# Patient Record
Sex: Male | Born: 1975
Health system: Southern US, Community
[De-identification: ages and names within clinical notes are randomized; demographics above are authoritative.]

## PROBLEM LIST (undated history)

## (undated) DIAGNOSIS — I10 Essential (primary) hypertension: Secondary | ICD-10-CM

## (undated) DIAGNOSIS — K219 Gastro-esophageal reflux disease without esophagitis: Secondary | ICD-10-CM

---

## 1997-07-10 ENCOUNTER — Ambulatory Visit (HOSPITAL_COMMUNITY): Admission: RE | Admit: 1997-07-10 | Discharge: 1997-07-10 | Payer: Self-pay | Admitting: Gastroenterology

## 1998-01-13 ENCOUNTER — Encounter: Payer: Self-pay | Admitting: Emergency Medicine

## 1998-01-14 ENCOUNTER — Inpatient Hospital Stay: Admission: EM | Admit: 1998-01-14 | Discharge: 1998-01-14 | Payer: Self-pay | Admitting: Emergency Medicine

## 1998-01-14 ENCOUNTER — Encounter: Payer: Self-pay | Admitting: *Deleted

## 1998-01-15 ENCOUNTER — Encounter: Payer: Self-pay | Admitting: *Deleted

## 1998-01-15 ENCOUNTER — Ambulatory Visit (HOSPITAL_COMMUNITY): Admission: RE | Admit: 1998-01-15 | Discharge: 1998-01-15 | Payer: Self-pay | Admitting: *Deleted

## 1998-10-30 ENCOUNTER — Emergency Department (HOSPITAL_COMMUNITY): Admission: EM | Admit: 1998-10-30 | Discharge: 1998-10-30 | Payer: Self-pay | Admitting: Emergency Medicine

## 2001-10-01 ENCOUNTER — Ambulatory Visit (HOSPITAL_BASED_OUTPATIENT_CLINIC_OR_DEPARTMENT_OTHER): Admission: RE | Admit: 2001-10-01 | Discharge: 2001-10-01 | Payer: Self-pay | Admitting: Internal Medicine

## 2003-04-12 HISTORY — PX: WISDOM TOOTH EXTRACTION: SHX21

## 2004-11-04 ENCOUNTER — Other Ambulatory Visit: Payer: Self-pay

## 2004-11-04 ENCOUNTER — Emergency Department: Payer: Self-pay | Admitting: Emergency Medicine

## 2005-12-21 ENCOUNTER — Emergency Department (HOSPITAL_COMMUNITY): Admission: EM | Admit: 2005-12-21 | Discharge: 2005-12-21 | Payer: Self-pay | Admitting: *Deleted

## 2005-12-22 ENCOUNTER — Ambulatory Visit: Payer: Self-pay | Admitting: Psychiatry

## 2005-12-22 ENCOUNTER — Inpatient Hospital Stay (HOSPITAL_COMMUNITY): Admission: AD | Admit: 2005-12-22 | Discharge: 2005-12-23 | Payer: Self-pay | Admitting: Psychiatry

## 2008-01-09 ENCOUNTER — Emergency Department (HOSPITAL_COMMUNITY): Admission: EM | Admit: 2008-01-09 | Discharge: 2008-01-09 | Payer: Self-pay | Admitting: Emergency Medicine

## 2010-01-06 ENCOUNTER — Emergency Department (HOSPITAL_COMMUNITY): Admission: EM | Admit: 2010-01-06 | Discharge: 2010-01-07 | Payer: Self-pay | Admitting: Emergency Medicine

## 2010-06-24 LAB — BASIC METABOLIC PANEL
BUN: 14 mg/dL (ref 6–23)
Calcium: 9.2 mg/dL (ref 8.4–10.5)
GFR calc non Af Amer: >60 mL/min (ref >60)
Glucose, Bld: 92 mg/dL (ref 70–99)

## 2010-06-24 LAB — DIFFERENTIAL
Basophils Absolute: 0 10*3/uL (ref 0.0–0.1)
Basophils Relative: 0 % (ref 0–1)
Eosinophils Absolute: 0.3 10*3/uL (ref 0.0–0.7)
Monocytes Absolute: 0.7 10*3/uL (ref 0.1–1.0)
Neutro Abs: 6.9 10*3/uL (ref 1.7–7.7)
Neutrophils Relative %: 63 % (ref 43–77)

## 2010-06-24 LAB — CBC
MCH: 32.2 pg (ref 26.0–34.0)
MCHC: 35.6 g/dL (ref 30.0–36.0)
RDW: 12.8 % (ref 11.5–15.5)

## 2010-08-27 NOTE — Discharge Summary (Signed)
NAMECHASTON, Hunter Price                 ACCOUNT NO.:  [id number]   MEDICAL RECORD NO.:  [id number]          PATIENT TYPE:  IPS   LOCATION:  0307                          FACILITY:  [facility]   PHYSICIAN:  Geoffery Lyons, M.D.      DATE OF BIRTH:  10-30-1975   DATE OF ADMISSION:  12/22/2005  DATE OF DISCHARGE:  12/23/2005                                 DISCHARGE SUMMARY   IDENTIFYING INFORMATION:  This is a 35 year old white male who is married.  This is a voluntary admission.   HISTORY OF PRESENT ILLNESS:  This patient presented as a walk-in to our  hospital yesterday requesting assistance with staying off alcohol.  He has  been binge-drinking and popping pills, both Xanax and hydrocodone in binge  pattern a couple of times a week in order to take the edge off my  irritability.  He endorses increased stress, depressed mood and  irritability since marital conflict began about two months ago.  He is now  separated from his wife but they are making arrangements to get back  together.  He has a history of similar binge-drinking and substance use in  the past but was completely abstinent and functioning without substances for  more than a year and wants to get back to being sober again.  He reports  that he starts popping the pills after he starts drinking and things lately  have been getting out of hand.  He has been drinking in binge pattern more  than a fifth a day and last Friday binged on two fifths in one day.  Last  Wednesday, he says he found himself popping more than 12 pills and presented  himself to the emergency room where he was medically cleared and discharged  for outpatient follow-up.  At this time, he denies any suicidal thoughts as  he never did intend to commit suicide but was becoming very careless with  his drinking.  He is requesting to be discharged today for outpatient follow-  up so he can maintain employment.   PAST PSYCHIATRIC HISTORY:  No history of suicide attempts.   Denies history  of suicidal ideation.  No prior history of detoxes.   SOCIAL HISTORY:  Single white male, currently married, 35 years old.  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  No current legal  charges.   FAMILY HISTORY:  Remarkable for a mother with a history of depression with  hospitalizations.   MEDICAL HISTORY:  The patient has no regular primary care Krissy Orebaugh.  The  patient is healthy.   MEDICATIONS:  None.  The patient has been using Xanax, hydrocodone off the  streets.  Last use on the 12th.  Last drink was on the 12th.   ALLERGIES:  No known drug allergies.   POSITIVE PHYSICAL FINDINGS:  The patient's full physical examination is  noted in the record.  Healthy white male.  CIWA gauged at less than 0.  Motor function intact.  No tremor.  It is nonfocal.  Gait is normal.  Cranial nerves 2-12 are intact.  No  remarkable findings.   LABORATORY DATA:  CBC with WBC 8.4, hemoglobin 15.6, hematocrit 44.6,  platelets 249,000.  Electrolytes with sodium 142, potassium 4.1, chloride  107, carbon dioxide 30, BUN 13, creatinine 1.5.  Random glucose 79.  Liver  enzymes with SGOT 24, SGPT 33, alkaline phosphatase 82 and total bilirubin  0.8.  Calcium normal at 9.9 and magnesium 2.2.  TSH within normal limits at  1.610.  Acetaminophen level was less than 10 when he was in the emergency  room last week.  Urine drug screen was positive at that time for opiates.  Negative for cocaine, benzodiazepines and all other substances and this was  on the 12th.   MENTAL STATUS EXAM:  Today, this is a fully alert male.  Pleasant,  cooperative, polite, appropriate.  Motor is normal.  CIWA 0.  Speech normal  in pace, tone, amount and production.  It is fluent, articulate.  Mood  euthymic.  He is pleasant and cooperative, joking, able to appreciate humor.  Thought process logical, coherent, goal directed.  He thought through his  triggers and what would help him stay sober.   Knows exactly what kind of an  experience in a group that he is looking for.  Able to describe his triggers  and what he plans to do about them.  Categorically contracts for safety.  Cognition is well-preserved.  No paranoia or flight of ideas.  No psychosis.  No dangerous ideas.   DIAGNOSES:  AXIS I:  Alcohol abuse.  Polysubstance abuse.  AXIS II:  No diagnosis.  AXIS III:  No diagnosis.  AXIS IV:  Moderate (problems with social functioning).  AXIS V:  Current 60; past year 66.   PLAN:  Discharge this patient to home.  He has been referred to the [facility] to follow up.  Understands the plan.  No medications were prescribed  today.  He is in agreement with the plan and will leave today.      Margaret A. Scott, N.P.      Geoffery Lyons, M.D.  Electronically Signed    MAS/MEDQ  D:  12/23/2005  T:  12/24/2005  Job:  [id number]

## 2011-05-21 ENCOUNTER — Encounter (HOSPITAL_COMMUNITY): Payer: Self-pay | Admitting: Emergency Medicine

## 2011-05-21 ENCOUNTER — Emergency Department (HOSPITAL_COMMUNITY)
Admission: EM | Admit: 2011-05-21 | Discharge: 2011-05-21 | Disposition: A | Discharge status: Home / Self Care | Payer: Self-pay | Attending: Emergency Medicine | Admitting: Emergency Medicine

## 2011-05-21 DIAGNOSIS — T7840XA Allergy, unspecified, initial encounter: Secondary | ICD-10-CM

## 2011-05-21 DIAGNOSIS — L509 Urticaria, unspecified: Secondary | ICD-10-CM | POA: Insufficient documentation

## 2011-05-21 DIAGNOSIS — I10 Essential (primary) hypertension: Secondary | ICD-10-CM | POA: Insufficient documentation

## 2011-05-21 DIAGNOSIS — R0602 Shortness of breath: Secondary | ICD-10-CM | POA: Insufficient documentation

## 2011-05-21 DIAGNOSIS — R21 Rash and other nonspecific skin eruption: Secondary | ICD-10-CM | POA: Insufficient documentation

## 2011-05-21 HISTORY — DX: Essential (primary) hypertension: I10

## 2011-05-21 MED ORDER — DIPHENHYDRAMINE HCL 50 MG/ML IJ SOLN
50.0000 mg | Freq: Once | INTRAMUSCULAR | Status: AC
  Administered 2011-05-21: 50 mg via INTRAVENOUS

## 2011-05-21 MED ORDER — FAMOTIDINE IN NACL 20-0.9 MG/50ML-% IV SOLN
20.0000 mg | Freq: Once | INTRAVENOUS | Status: AC
  Administered 2011-05-21: 20 mg via INTRAVENOUS

## 2011-05-21 MED ORDER — EPINEPHRINE 0.3 MG/0.3ML IJ DEVI
INTRAMUSCULAR | Status: AC
  Filled 2011-05-21: qty 0.3

## 2011-05-21 MED ORDER — EPINEPHRINE 0.3 MG/0.3ML IJ DEVI
0.3000 mg | Freq: Once | INTRAMUSCULAR | Status: AC
  Administered 2011-05-21: 0.3 mg via INTRAMUSCULAR

## 2011-05-21 MED ORDER — DIPHENHYDRAMINE HCL 50 MG/ML IJ SOLN
INTRAMUSCULAR | Status: AC
  Administered 2011-05-21: 50 mg via INTRAVENOUS
  Filled 2011-05-21: qty 1

## 2011-05-21 MED ORDER — HYDROCORTISONE SOD SUCCINATE 100 MG IJ SOLR
INTRAMUSCULAR | Status: AC
  Administered 2011-05-21: 125 mg
  Filled 2011-05-21: qty 4

## 2011-05-21 MED ORDER — FAMOTIDINE IN NACL 20-0.9 MG/50ML-% IV SOLN
INTRAVENOUS | Status: AC
  Administered 2011-05-21: 20 mg via INTRAVENOUS
  Filled 2011-05-21: qty 50

## 2011-05-21 MED ORDER — METHYLPREDNISOLONE SODIUM SUCC 125 MG IJ SOLR: 125.0000 mg | Freq: Once | INTRAMUSCULAR | Status: DC

## 2011-05-21 MED ORDER — EPINEPHRINE 0.3 MG/0.3ML IJ DEVI: 0.3000 mg | INTRAMUSCULAR | Status: AC | PRN

## 2011-05-21 MED ORDER — PREDNISONE 10 MG PO TABS: 20.0000 mg | ORAL_TABLET | Freq: Every day | ORAL | Status: DC

## 2011-05-21 NOTE — ED Notes (Signed)
MD at bedside. 

## 2011-05-21 NOTE — ED Notes (Signed)
Pt c/o allergic reaction and swelling in the throat.  Pt states he is breaking out hives on legs, torso, arms, and back. Pt states symptoms are gradually getting worse.

## 2011-05-21 NOTE — ED Provider Notes (Signed)
History     CSN: [id number]  Arrival date & time 05/21/11  1918   First MD Initiated Contact with Patient 05/21/11 1933      Chief Complaint  Patient presents with  . Allergic Reaction    throat closing    (Consider location/radiation/quality/duration/timing/severity/associated sxs/prior treatment) Patient is a 36 y.o. male presenting with allergic reaction. The history is provided by the patient.  Allergic Reaction The primary symptoms are  shortness of breath, rash and urticaria. The primary symptoms do not include wheezing, abdominal pain, nausea, vomiting, dizziness or angioedema. The current episode started 6 to 12 hours ago. The problem has been gradually worsening. This is a new problem.  The rash is associated with itching.   Significant symptoms also include itching.  Pt noticed hives on his ext and abd this afternoon. He then began to feel swelling in his throat with some mild voice changes. He does not recall any new foods, medicines, or exposures. No cough or wheezing. No prev hx of angioedema.   Past Medical History  Diagnosis Date  . Hypertension     No past surgical history on file.  No family history on file.  History  Substance Use Topics  . Smoking status: Not on file  . Smokeless tobacco: Not on file  . Alcohol Use:       Review of Systems  Constitutional: Negative for fever and chills.  HENT: Positive for trouble swallowing and voice change.   Respiratory: Positive for shortness of breath. Negative for wheezing.   Cardiovascular: Negative for chest pain.  Gastrointestinal: Negative for nausea, vomiting and abdominal pain.  Musculoskeletal: Negative for joint swelling.  Skin: Positive for itching and rash.  Neurological: Negative for dizziness, weakness, numbness and headaches.    Allergies  Amoxicillin and Penicillins  Home Medications   Current Outpatient Rx  Name Route Sig Dispense Refill  . EPINEPHRINE 0.3 MG/0.3ML IJ DEVI  Intramuscular Inject 0.3 mLs (0.3 mg total) into the muscle as needed. 1 Device 0  . PREDNISONE 10 MG PO TABS Oral Take 2 tablets (20 mg total) by mouth daily. 10 tablet 0    BP 118/84  Pulse 75  Resp 16  SpO2 98%  Physical Exam  Nursing note and vitals reviewed. Constitutional: He is oriented to person, place, and time. He appears well-developed and well-nourished. No distress.  HENT:  Head: Normocephalic and atraumatic.  Mouth/Throat: Oropharynx is clear and moist.       No tongue or lip swelling  Eyes: EOM are normal. Pupils are equal, round, and reactive to light.  Neck: Normal range of motion. Neck supple.  Cardiovascular: Normal rate and regular rhythm.   Pulmonary/Chest: Effort normal and breath sounds normal. No stridor. No respiratory distress. He has no wheezes. He has no rales.  Abdominal: Soft. Bowel sounds are normal. He exhibits no mass. There is no tenderness. There is no rebound and no guarding.  Musculoskeletal: Normal range of motion. He exhibits no edema and no tenderness.  Neurological: He is alert and oriented to person, place, and time.       5/5 motor, sensation intact  Skin: Skin is warm and dry. Rash noted. There is erythema.       Erythematous macules. Blanching. Pruritic. Located on bl lower ext and lower abdomen  Psychiatric: He has a normal mood and affect. His behavior is normal.    ED Course  Procedures (including critical care time)  Labs Reviewed - No data to display No results  found.   1. Allergic reaction       MDM    Pt states he is feeling much better. No airway swelling or compromise. Rash has improved significantly. I have observed pt in ED for several hours with no return of respiratory symptoms. I have advised pt to return immediately for any changes or concerns        Loren Racer, MD 05/21/11 [phone]

## 2011-07-10 ENCOUNTER — Ambulatory Visit (INDEPENDENT_AMBULATORY_CARE_PROVIDER_SITE_OTHER): Payer: Self-pay | Admitting: Family Medicine

## 2011-07-10 VITALS — BP 142/89 | HR 71 | Temp 98.3°F | Resp 16 | Ht 69.0 in | Wt 233.0 lb

## 2011-07-10 DIAGNOSIS — G4700 Insomnia, unspecified: Secondary | ICD-10-CM

## 2011-07-10 DIAGNOSIS — B353 Tinea pedis: Secondary | ICD-10-CM

## 2011-07-10 DIAGNOSIS — F419 Anxiety disorder, unspecified: Secondary | ICD-10-CM

## 2011-07-10 MED ORDER — CLONAZEPAM 0.5 MG PO TABS: 0.5000 mg | ORAL_TABLET | Freq: Two times a day (BID) | ORAL | Status: DC | PRN

## 2011-07-10 MED ORDER — TERBINAFINE HCL 250 MG PO TABS: 250.0000 mg | ORAL_TABLET | Freq: Every day | ORAL | Status: DC

## 2011-07-10 NOTE — Progress Notes (Signed)
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  This makes life horrible for Hunter Price.  O:  Appropriate Also, feet have water blisters on arches  A:  Tinea pedis Stress  P:  Clonazepam 0.5 bid prn lamisel 250 qd x 2 weeks.

## 2011-08-09 ENCOUNTER — Ambulatory Visit (INDEPENDENT_AMBULATORY_CARE_PROVIDER_SITE_OTHER): Payer: Self-pay | Admitting: Internal Medicine

## 2011-08-09 VITALS — BP 142/80 | HR 85 | Temp 98.3°F | Resp 16 | Ht 69.0 in | Wt 232.0 lb

## 2011-08-09 DIAGNOSIS — T63001A Toxic effect of unspecified snake venom, accidental (unintentional), initial encounter: Secondary | ICD-10-CM

## 2011-08-09 DIAGNOSIS — B353 Tinea pedis: Secondary | ICD-10-CM

## 2011-08-09 DIAGNOSIS — W5911XA Bitten by nonvenomous snake, initial encounter: Secondary | ICD-10-CM

## 2011-08-09 DIAGNOSIS — T6391XA Toxic effect of contact with unspecified venomous animal, accidental (unintentional), initial encounter: Secondary | ICD-10-CM

## 2011-08-09 MED ORDER — TERBINAFINE HCL 250 MG PO TABS: 250.0000 mg | ORAL_TABLET | Freq: Every day | ORAL | Status: AC

## 2011-08-09 MED ORDER — CEPHALEXIN 500 MG PO CAPS: 500.0000 mg | ORAL_CAPSULE | Freq: Four times a day (QID) | ORAL | Status: AC

## 2011-08-09 NOTE — Patient Instructions (Signed)
Wash daily. Watch for signs of infection. Keflex as directed. advil for pain.

## 2011-08-09 NOTE — Progress Notes (Signed)
  Subjective:    Patient ID: Hunter Price, male    DOB: 1975/07/17, 36 y.o.   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 has the snake.  has tingling to the area.  no redness no pain.  moderate in severity  no radiation  has a headache Moderated in severity 6/10 no feverReview of Systems  Constitutional: Negative.   HENT: Negative.   Eyes: Negative.   Respiratory: Negative.   Cardiovascular: Negative.   Gastrointestinal: Negative.   Genitourinary: Negative.   Musculoskeletal: Negative.   Skin: Negative.   Neurological: Negative.   Hematological: Negative.   Psychiatric/Behavioral: Negative.   All other systems reviewed and are negative.       Objective:   Physical Exam  Nursing note and vitals reviewed. Constitutional: He is oriented to person, place, and time. He appears well-developed and well-nourished.  HENT:  Head: Normocephalic and atraumatic.  Right Ear: External ear normal.  Left Ear: External ear normal.  Eyes: Conjunctivae and EOM are normal. Pupils are equal, round, and reactive to light.  Neck: Normal range of motion. Neck supple.  Cardiovascular: Normal rate and regular rhythm.   Pulmonary/Chest: Effort normal and breath sounds normal.  Abdominal: Soft. Bowel sounds are normal.  Musculoskeletal: He exhibits tenderness.       Right hand tender no redness no bite marks  Neurological: He is alert and oriented to person, place, and time. He has normal reflexes.  Skin: Skin is warm and dry.  Psychiatric: He has a normal mood and affect. His behavior is normal. Judgment and thought content normal.   Snake is a small black snake not a copperhead pts tetanus was within the past 6 years.        Assessment & Plan:  Snake bite Wash with soap and water rx keflex Attain tetanus immunization status ok

## 2011-08-22 ENCOUNTER — Ambulatory Visit (INDEPENDENT_AMBULATORY_CARE_PROVIDER_SITE_OTHER): Payer: Self-pay | Admitting: Family Medicine

## 2011-08-22 ENCOUNTER — Encounter: Payer: Self-pay | Admitting: Family Medicine

## 2011-08-22 VITALS — BP 149/90 | HR 73 | Temp 97.8°F | Resp 18

## 2011-08-22 DIAGNOSIS — R21 Rash and other nonspecific skin eruption: Secondary | ICD-10-CM

## 2011-08-22 DIAGNOSIS — F419 Anxiety disorder, unspecified: Secondary | ICD-10-CM

## 2011-08-22 LAB — POCT GLYCOSYLATED HEMOGLOBIN (HGB A1C): Hemoglobin A1C: 5.1

## 2011-08-22 LAB — GLUCOSE, POCT (MANUAL RESULT ENTRY): POC Glucose: 77

## 2011-08-22 MED ORDER — CLONAZEPAM 1 MG PO TABS: 1.0000 mg | ORAL_TABLET | Freq: Two times a day (BID) | ORAL | Status: DC | PRN

## 2011-08-22 NOTE — Progress Notes (Signed)
36 year old gentleman who comes in with his father to be seen. He complains of nonhealing areas on her skin: Abdomen, back, left forearm. He was rechecked for diabetes. His father is had elevated blood sugars in the past.  Objective multiple small 3-4 mm papules, one large 1 cm deep and hard subcutaneous nodule right buttock, 4 cm streaky rash on right abdomen. Results for orders placed during the hospital encounter of 01/06/10  BASIC METABOLIC PANEL      Component Value Range   Sodium 137  135 - 145 (mEq/L)   Potassium 3.4 (*) 3.5 - 5.1 (mEq/L)   Chloride 104  96 - 112 (mEq/L)   CO2 26  19 - 32 (mEq/L)   Glucose, Bld 92  70 - 99 (mg/dL)   BUN 14  6 - 23 (mg/dL)   Creatinine, Ser 1.47  0.4 - 1.5 (mg/dL)   Calcium 9.2  8.4 - 82.9 (mg/dL)   GFR calc non Af Amer >60  >60 (mL/min)   GFR calc Af Amer    >60 (mL/min)   Value: >60            The eGFR has been calculated     using the MDRD equation.     This calculation has not been     validated in all clinical     situations.     eGFR's persistently     <60 mL/min signify     possible Chronic Kidney Disease.  CBC      Component Value Range   WBC 11.0 (*) 4.0 - 10.5 (K/uL)   RBC 5.13  4.22 - 5.81 (MIL/uL)   Hemoglobin 16.5  13.0 - 17.0 (g/dL)   HCT 56.2  13.0 - 86.5 (%)   MCV 90.3  78.0 - 100.0 (fL)   MCH 32.2  26.0 - 34.0 (pg)   MCHC 35.6  30.0 - 36.0 (g/dL)   RDW 78.4  69.6 - 29.5 (%)   Platelets 227  150 - 400 (K/uL)  DIFFERENTIAL      Component Value Range   Neutrophils Relative 63  43 - 77 (%)   Neutro Abs 6.9  1.7 - 7.7 (K/uL)   Lymphocytes Relative 28  12 - 46 (%)   Lymphs Abs 3.1  0.7 - 4.0 (K/uL)   Monocytes Relative 6  3 - 12 (%)   Monocytes Absolute 0.7  0.1 - 1.0 (K/uL)   Eosinophils Relative 3  0 - 5 (%)   Eosinophils Absolute 0.3  0.0 - 0.7 (K/uL)   Basophils Relative 0  0 - 1 (%)   Basophils Absolute 0.0  0.0 - 0.1 (K/uL)     Assessment: Probable staph  Plan: Doxycycline 100 twice a day x7  Results for  orders placed in visit on 08/22/11  GLUCOSE, POCT (MANUAL RESULT ENTRY)      Component Value Range   POC Glucose 77    POCT GLYCOSYLATED HEMOGLOBIN (HGB A1C)      Component Value Range   Hemoglobin A1C 5.1

## 2012-11-11 ENCOUNTER — Emergency Department (HOSPITAL_COMMUNITY)
Admission: EM | Admit: 2012-11-11 | Discharge: 2012-11-11 | Disposition: A | Discharge status: Home / Self Care | Payer: Self-pay | Attending: Emergency Medicine | Admitting: Emergency Medicine

## 2012-11-11 ENCOUNTER — Encounter (HOSPITAL_COMMUNITY): Payer: Self-pay | Admitting: *Deleted

## 2012-11-11 DIAGNOSIS — Y939 Activity, unspecified: Secondary | ICD-10-CM | POA: Insufficient documentation

## 2012-11-11 DIAGNOSIS — L255 Unspecified contact dermatitis due to plants, except food: Secondary | ICD-10-CM | POA: Insufficient documentation

## 2012-11-11 DIAGNOSIS — IMO0001 Reserved for inherently not codable concepts without codable children: Secondary | ICD-10-CM | POA: Insufficient documentation

## 2012-11-11 DIAGNOSIS — I10 Essential (primary) hypertension: Secondary | ICD-10-CM | POA: Insufficient documentation

## 2012-11-11 DIAGNOSIS — Z880 Allergy status to penicillin: Secondary | ICD-10-CM | POA: Insufficient documentation

## 2012-11-11 DIAGNOSIS — F17200 Nicotine dependence, unspecified, uncomplicated: Secondary | ICD-10-CM | POA: Insufficient documentation

## 2012-11-11 DIAGNOSIS — L237 Allergic contact dermatitis due to plants, except food: Secondary | ICD-10-CM

## 2012-11-11 DIAGNOSIS — Y929 Unspecified place or not applicable: Secondary | ICD-10-CM | POA: Insufficient documentation

## 2012-11-11 MED ORDER — DEXAMETHASONE SODIUM PHOSPHATE 10 MG/ML IJ SOLN
10.0000 mg | Freq: Once | INTRAMUSCULAR | Status: AC
  Administered 2012-11-11: 10 mg via INTRAMUSCULAR
  Filled 2012-11-11: qty 1

## 2012-11-11 MED ORDER — PREDNISONE 20 MG PO TABS: ORAL_TABLET | ORAL | Status: DC

## 2012-11-11 NOTE — ED Notes (Signed)
Pt states has had poison ivy x 4 days, on arms, legs, feet and genitals. States needs a shot of prednisone.

## 2012-11-11 NOTE — ED Provider Notes (Signed)
Medical screening examination/treatment/procedure(s) were performed by non-physician practitioner and as supervising physician I was immediately available for consultation/collaboration.   Charles B. Sheldon, MD 11/11/12 2306 

## 2012-11-11 NOTE — ED Provider Notes (Signed)
CSN: [id number]     Arrival date & time 11/11/12  1829 History  This chart was scribed for Hunter Emery, PA-C working with Hunter Price Mayers, MD by Greggory Stallion, ED scribe. This patient was seen in room WTR5/WTR5 and the patient's care was started at 6:33 PM.   Chief Complaint  Patient presents with  . Poison Ivy   The history is provided by the patient. No language interpreter was used.    HPI Comments: Hunter Price is a 37 y.o. male who presents to the Emergency Department complaining of  gradually worsening painful rash to his arms, legs, feet, and genitals that started 4 days ago. Pt states it's poison ivy. He states when he normally gets poison ivy, a shot of prednisone works best. He denies any other associated symptoms. Patient has been working in the yard prior to onset. Denies fever, nausea vomiting, oral lesions, shortness of breath.  Past Medical History  Diagnosis Date  . Hypertension    No past surgical history on file. No family history on file. History  Substance Use Topics  . Smoking status: Current Every Day Smoker -- 1.00 packs/day for 10 years  . Smokeless tobacco: Not on file  . Alcohol Use: Not on file    Review of Systems  A complete 10 system review of systems was obtained and all systems are negative except as noted in the HPI and PMH.   Allergies  Amoxicillin and Penicillins  Home Medications   Current Outpatient Rx  Name  Route  Sig  Dispense  Refill  . EXPIRED: clonazePAM (KLONOPIN) 1 MG tablet   Oral   Take 1 tablet (1 mg total) by mouth 2 (two) times daily as needed for anxiety.   60 tablet   2   . EPINEPHrine (EPIPEN) 0.3 mg/0.3 mL DEVI   Intramuscular   Inject 0.3 mLs (0.3 mg total) into the muscle as needed.   1 Device   0    BP 142/79  Pulse 94  Temp(Src) 98.4 F (36.9 C) (Oral)  Resp 20  Ht 5\' 9"  (1.753 m)  SpO2 97%  Physical Exam  Nursing note and vitals reviewed. Constitutional: He is oriented to person, place,  and time. He appears well-developed and well-nourished. No distress.  HENT:  Head: Normocephalic.  Mouth/Throat: Oropharynx is clear and moist.  Eyes: Conjunctivae and EOM are normal.  Cardiovascular: Normal rate.   Pulmonary/Chest: Effort normal and breath sounds normal. No stridor.  Abdominal: Soft.  Musculoskeletal: Normal range of motion.  Neurological: He is alert and oriented to person, place, and time.  Skin:  Erythematous vesicular rash to bilateral upper/lower extremities, torso and genitalia.   Psychiatric: He has a normal mood and affect.    ED Course   Procedures (including critical care time)  DIAGNOSTIC STUDIES: Oxygen Saturation is 97% on RA, normal by my interpretation.    COORDINATION OF CARE: 6:39 PM-Discussed treatment plan which includes a steroid shot in the ED and calamine lotion at home with pt at bedside and pt agreed to plan.   Labs Reviewed - No data to display No results found. 1. Contact dermatitis due to poison ivy     MDM   Filed Vitals:   11/11/12 1836  BP: 142/79  Pulse: 94  Temp: 98.4 F (36.9 C)  TempSrc: Oral  Resp: 20  Height: 5\' 9"  (1.753 m)  SpO2: 97%     Hunter Price is a 37 y.o. male with poison ivy dermatitis,  no signs of secondary infection.  Medications  dexamethasone (DECADRON) injection 10 mg (not administered)    Pt is hemodynamically stable, appropriate for, and amenable to discharge at this time. Pt verbalized understanding and agrees with care plan. All questions answered. Outpatient follow-up and specific return precautions discussed.    New Prescriptions   PREDNISONE (DELTASONE) 20 MG TABLET    3 tabs po daily x 3 days, then 2 tabs x 3 days, then 1.5 tabs x 3 days, then 1 tab x 3 days, then 0.5 tabs x 3 days    I personally performed the services described in this documentation, which was scribed in my presence. The recorded information has been reviewed and is accurate.  Note: Portions of this report may  have been transcribed using voice recognition software. Every effort was made to ensure accuracy; however, inadvertent computerized transcription errors may be present    Hunter Emery, PA-C 11/11/12 1846

## 2013-04-26 ENCOUNTER — Encounter (HOSPITAL_COMMUNITY): Payer: Self-pay | Admitting: Emergency Medicine

## 2013-04-26 ENCOUNTER — Emergency Department (INDEPENDENT_AMBULATORY_CARE_PROVIDER_SITE_OTHER)
Admission: EM | Admit: 2013-04-26 | Discharge: 2013-04-26 | Disposition: A | Discharge status: Home / Self Care | Payer: Self-pay | Source: Home / Self Care | Attending: Family Medicine | Admitting: Family Medicine

## 2013-04-26 DIAGNOSIS — J40 Bronchitis, not specified as acute or chronic: Secondary | ICD-10-CM

## 2013-04-26 DIAGNOSIS — I889 Nonspecific lymphadenitis, unspecified: Secondary | ICD-10-CM

## 2013-04-26 DIAGNOSIS — K219 Gastro-esophageal reflux disease without esophagitis: Secondary | ICD-10-CM

## 2013-04-26 DIAGNOSIS — IMO0001 Reserved for inherently not codable concepts without codable children: Secondary | ICD-10-CM

## 2013-04-26 MED ORDER — OMEPRAZOLE 40 MG PO CPDR: 40.0000 mg | DELAYED_RELEASE_CAPSULE | Freq: Every day | ORAL | Status: DC

## 2013-04-26 MED ORDER — TRAMADOL HCL 50 MG PO TABS: 50.0000 mg | ORAL_TABLET | Freq: Four times a day (QID) | ORAL | Status: DC | PRN

## 2013-04-26 MED ORDER — AZITHROMYCIN 250 MG PO TABS: 250.0000 mg | ORAL_TABLET | Freq: Every day | ORAL | Status: DC

## 2013-04-26 MED ORDER — PREDNISONE 10 MG PO TABS: 30.0000 mg | ORAL_TABLET | Freq: Every day | ORAL | Status: DC

## 2013-04-26 NOTE — ED Provider Notes (Signed)
Hunter KarvonenMark C Price is a 38 y.o. male who presents to Urgent Care today for right ear pain cough congestion sore throat present for the last week or so. Patient has had sick contacts at home with a viral bronchitis. Patient also notes pain from his right ear radiating to his right anterior neck. He's tried Sudafed which has not helped much. He is allergic to penicillin. Pain is moderate.  And also notes heartburn symptoms for the past month or so. He is using multiple times a day.  Past Medical History  Diagnosis Date  . Hypertension    History  Substance Use Topics  . Smoking status: Current Every Day Smoker -- 1.00 packs/day for 10 years    Types: Cigarettes  . Smokeless tobacco: Never Used  . Alcohol Use: No   ROS as above Medications: No current facility-administered medications for this encounter.   Current Outpatient Prescriptions  Medication Sig Dispense Refill  . azithromycin (ZITHROMAX) 250 MG tablet Take 1 tablet (250 mg total) by mouth daily. Take first 2 tablets together, then 1 every day until finished.  6 tablet  0  . EPINEPHrine (EPIPEN) 0.3 mg/0.3 mL DEVI Inject 0.3 mLs (0.3 mg total) into the muscle as needed.  1 Device  0  . omeprazole (PRILOSEC) 40 MG capsule Take 1 capsule (40 mg total) by mouth daily.  30 capsule  1  . predniSONE (DELTASONE) 10 MG tablet Take 3 tablets (30 mg total) by mouth daily.  15 tablet  0  . traMADol (ULTRAM) 50 MG tablet Take 1 tablet (50 mg total) by mouth every 6 (six) hours as needed (pain or cough).  15 tablet  0  . [DISCONTINUED] clonazePAM (KLONOPIN) 1 MG tablet Take 1 tablet (1 mg total) by mouth 2 (two) times daily as needed for anxiety.  60 tablet  2    Exam:  BP 128/83  Pulse 80  Temp(Src) 98.7 F (37.1 C) (Oral)  Resp 18  SpO2 100%  Gen: Well NAD HEENT: EOMI,  MMM, tympanic membranes are normal appearing bilaterally. Posterior pharynx with cobblestoning. Tender moderate right cervical lymphadenopathy present.  No neck no  stiffness or meningismus Lungs: Normal work of breathing. CTABL Heart: RRR no MRG Abd: NABS, Soft. NT, ND Exts: Brisk capillary refill, warm and well perfused.   No results found for this or any previous visit (from the past 24 hour(s)). No results found.  Assessment and Plan: 38 y.o. male with bronchitis. Patient also may have lymphadenitis. Plan to treat with azithromycin prednisone. Also use tramadol for cough.   Patient also has GERD symptoms. Will use omeprazole  Discussed warning signs or symptoms. Please see discharge instructions. Patient expresses understanding.    Rodolph BongEvan S Darianne Muralles, MD 04/26/13 [phone]

## 2013-04-26 NOTE — ED Notes (Signed)
C/o cold sx States he has a sore neck, left ear is itching, right ear is stopped up, and dry cough States better half was dx with bronchitis and URI Sudafed and other OTC medications taken

## 2013-04-26 NOTE — Discharge Instructions (Signed)
Thank you for coming in today. Take prednisone as directed. Take azithromycin daily for 5 days. Use tramadol for pain or cough. Take omeprazole daily for heartburn. Call or go to the emergency room if you get worse, have trouble breathing, have chest pains, or palpitations.

## 2013-05-20 ENCOUNTER — Ambulatory Visit: Payer: Self-pay

## 2013-05-31 ENCOUNTER — Encounter (HOSPITAL_COMMUNITY): Payer: Self-pay | Admitting: Emergency Medicine

## 2013-05-31 ENCOUNTER — Emergency Department (HOSPITAL_COMMUNITY)
Admission: EM | Admit: 2013-05-31 | Discharge: 2013-06-01 | Discharge status: Against Medical Advice | Payer: Self-pay | Attending: Emergency Medicine | Admitting: Emergency Medicine

## 2013-05-31 ENCOUNTER — Ambulatory Visit: Payer: Self-pay | Admitting: Internal Medicine

## 2013-05-31 DIAGNOSIS — F17200 Nicotine dependence, unspecified, uncomplicated: Secondary | ICD-10-CM | POA: Insufficient documentation

## 2013-05-31 DIAGNOSIS — I10 Essential (primary) hypertension: Secondary | ICD-10-CM | POA: Insufficient documentation

## 2013-05-31 DIAGNOSIS — R197 Diarrhea, unspecified: Secondary | ICD-10-CM | POA: Insufficient documentation

## 2013-05-31 DIAGNOSIS — R112 Nausea with vomiting, unspecified: Secondary | ICD-10-CM | POA: Insufficient documentation

## 2013-05-31 DIAGNOSIS — K219 Gastro-esophageal reflux disease without esophagitis: Secondary | ICD-10-CM | POA: Insufficient documentation

## 2013-05-31 DIAGNOSIS — Z79899 Other long term (current) drug therapy: Secondary | ICD-10-CM | POA: Insufficient documentation

## 2013-05-31 DIAGNOSIS — Z880 Allergy status to penicillin: Secondary | ICD-10-CM | POA: Insufficient documentation

## 2013-05-31 DIAGNOSIS — R142 Eructation: Secondary | ICD-10-CM | POA: Insufficient documentation

## 2013-05-31 DIAGNOSIS — R143 Flatulence: Secondary | ICD-10-CM

## 2013-05-31 DIAGNOSIS — R141 Gas pain: Secondary | ICD-10-CM | POA: Insufficient documentation

## 2013-05-31 DIAGNOSIS — R109 Unspecified abdominal pain: Secondary | ICD-10-CM

## 2013-05-31 DIAGNOSIS — R1011 Right upper quadrant pain: Secondary | ICD-10-CM | POA: Insufficient documentation

## 2013-05-31 LAB — COMPREHENSIVE METABOLIC PANEL
ALK PHOS: 80 U/L (ref 39–117)
ALT: 32 U/L (ref 0–53)
AST: 25 U/L (ref 0–37)
Albumin: 4.1 g/dL (ref 3.5–5.2)
BILIRUBIN TOTAL: 0.4 mg/dL (ref 0.3–1.2)
BUN: 13 mg/dL (ref 6–23)
CHLORIDE: 102 meq/L (ref 96–112)
CO2: 25 mEq/L (ref 19–32)
Calcium: 9.9 mg/dL (ref 8.4–10.5)
Creatinine, Ser: 0.99 mg/dL (ref 0.50–1.35)
GLUCOSE: 82 mg/dL (ref 70–99)
POTASSIUM: 4.2 meq/L (ref 3.7–5.3)
Sodium: 141 mEq/L (ref 137–147)
Total Protein: 7.7 g/dL (ref 6.0–8.3)

## 2013-05-31 LAB — CBC WITH DIFFERENTIAL/PLATELET
Basophils Absolute: 0 10*3/uL (ref 0.0–0.1)
Basophils Relative: 0 % (ref 0–1)
EOS ABS: 0.2 10*3/uL (ref 0.0–0.7)
Eosinophils Relative: 2 % (ref 0–5)
HEMATOCRIT: 42.6 % (ref 39.0–52.0)
HEMOGLOBIN: 14.5 g/dL (ref 13.0–17.0)
LYMPHS ABS: 3.1 10*3/uL (ref 0.7–4.0)
Lymphocytes Relative: 37 % (ref 12–46)
MCH: 30.8 pg (ref 26.0–34.0)
MCHC: 34 g/dL (ref 30.0–36.0)
MCV: 90.4 fL (ref 78.0–100.0)
MONOS PCT: 10 % (ref 3–12)
Monocytes Absolute: 0.9 10*3/uL (ref 0.1–1.0)
NEUTROS ABS: 4.3 10*3/uL (ref 1.7–7.7)
NEUTROS PCT: 51 % (ref 43–77)
Platelets: 224 10*3/uL (ref 150–400)
RBC: 4.71 MIL/uL (ref 4.22–5.81)
RDW: 12.6 % (ref 11.5–15.5)
WBC: 8.6 10*3/uL (ref 4.0–10.5)

## 2013-05-31 LAB — URINALYSIS, ROUTINE W REFLEX MICROSCOPIC
Bilirubin Urine: NEGATIVE
Glucose, UA: NEGATIVE mg/dL
Hgb urine dipstick: NEGATIVE
Ketones, ur: NEGATIVE mg/dL
Leukocytes, UA: NEGATIVE
Nitrite: NEGATIVE
Protein, ur: NEGATIVE mg/dL
Specific Gravity, Urine: 1.03 (ref 1.005–1.030)
Urobilinogen, UA: 0.2 mg/dL (ref 0.0–1.0)
pH: 6 (ref 5.0–8.0)

## 2013-05-31 LAB — LIPASE, BLOOD: LIPASE: 26 U/L (ref 11–59)

## 2013-05-31 NOTE — ED Notes (Signed)
Pt reports he had GI upset last weekend with n/v/d for a day, then this subsided but has continued to have R sided flank pain intermittently, and abdominal pain after eating any solid food. Pt states that he has had polyuria with an inability to void at times. Pt states that he has been diagnosed with heartburn and takes Prilosec but has continued to have this burning.  Pt a&o x4, ambulatory to triage.

## 2013-06-01 NOTE — ED Provider Notes (Signed)
CSN: [id number]     Arrival date & time 05/31/13  1947 History   First MD Initiated Contact with Patient 06/01/13 0113     Chief Complaint  Patient presents with  . Abdominal Pain  . Flank Pain   HPI  History provided by the patient. Patient is a 38 year old male with history of hypertension and GERD who presents with complaints of worsening upper abdominal pain as well as symptoms of nausea vomiting diarrhea. Patient reports having some worsening upper abdominal pain as well as right-sided pains for the past few days. Yesterday he also began having some loose diarrhea-type stools with some nausea and episode of vomiting. Patient states he was diagnosed with GERD in the past and normally takes Prilosec and multiple times throughout the day but states he continues to have significant belching and burning sensations to his epigastric area. Only recently he began having more sharp right-sided pains as well. Symptoms seem to be somewhat worse when he first wakes up in the morning but are also worsened with any eating and drinking. He has not used any other medications to help with his symptoms. He denies having any associated fever, chills or sweats. No other aggravating or alleviating factors. No other associated symptoms.   Past Medical History  Diagnosis Date  . Hypertension    Past Surgical History  Procedure Laterality Date  . Wisdom tooth extraction Bilateral 2005    x4   History reviewed. No pertinent family history. History  Substance Use Topics  . Smoking status: Current Every Day Smoker -- 1.00 packs/day for 10 years    Types: Cigarettes  . Smokeless tobacco: Never Used  . Alcohol Use: Yes     Comment: occ.    Review of Systems  Constitutional: Negative for fever, chills and diaphoresis.  HENT: Negative for congestion.   Respiratory: Negative for cough.   Gastrointestinal: Positive for nausea, vomiting, abdominal pain and diarrhea.  Genitourinary: Negative for dysuria,  frequency, hematuria and flank pain.  All other systems reviewed and are negative.      Allergies  Amoxicillin and Penicillins  Home Medications   Current Outpatient Rx  Name  Route  Sig  Dispense  Refill  . calcium carbonate (TUMS - DOSED IN MG ELEMENTAL CALCIUM) 500 MG chewable tablet   Oral   Chew 1 tablet by mouth daily as needed for indigestion or heartburn (indigestion).         Marland Kitchen omeprazole (PRILOSEC) 40 MG capsule   Oral   Take 1 capsule (40 mg total) by mouth daily.   30 capsule   1   . EPINEPHrine (EPIPEN) 0.3 mg/0.3 mL DEVI   Intramuscular   Inject 0.3 mLs (0.3 mg total) into the muscle as needed.   1 Device   0    BP 149/89  Pulse 83  Temp(Src) 97.7 F (36.5 C) (Oral)  Resp 18  Ht 5\' 9"  (1.753 m)  Wt 258 lb (117.028 kg)  BMI 38.08 kg/m2  SpO2 96% Physical Exam  Nursing note and vitals reviewed. Constitutional: He is oriented to person, place, and time. He appears well-developed and well-nourished. No distress.  HENT:  Head: Normocephalic.  Cardiovascular: Normal rate and regular rhythm.   Pulmonary/Chest: Effort normal and breath sounds normal. No respiratory distress. He has no wheezes.  Abdominal: Soft. There is tenderness in the right upper quadrant. There is no rebound, no guarding, no CVA tenderness, no tenderness at McBurney's point and negative Murphy's sign.  Neurological: He is alert  and oriented to person, place, and time.  Skin: Skin is warm.  Psychiatric: He has a normal mood and affect. His behavior is normal.    ED Course  Procedures   DIAGNOSTIC STUDIES: Oxygen Saturation is 96% on RA.    COORDINATION OF CARE:  Nursing notes reviewed. Vital signs reviewed. Initial pt interview and examination performed.   1:33 AM-patient seen and evaluated. Patient well-appearing in no acute distress. Patient does not appear in severe pain or discomfort. Patient was putting on his clothes stating he was rate to leave and has been waiting too  long. He was willing to discuss his symptoms with knee and allow for physical exam. He had some right upper quadrant tenderness without Murphy sign. No peritoneal signs. I discussed his laboratory findings which essentially were normal. I discussed options for further workup including ultrasound to evaluate the gallbladder. Patient did not wish to have any further testing. He did not wish to stay for any paperwork or referrals. He walked out of the room after I spoke with him.    Results for orders placed during the hospital encounter of 05/31/13  CBC WITH DIFFERENTIAL      Result Value Ref Range   WBC 8.6  4.0 - 10.5 K/uL   RBC 4.71  4.22 - 5.81 MIL/uL   Hemoglobin 14.5  13.0 - 17.0 g/dL   HCT 16.142.6  09.639.0 - 04.552.0 %   MCV 90.4  78.0 - 100.0 fL   MCH 30.8  26.0 - 34.0 pg   MCHC 34.0  30.0 - 36.0 g/dL   RDW 40.912.6  81.111.5 - 91.415.5 %   Platelets 224  150 - 400 K/uL   Neutrophils Relative % 51  43 - 77 %   Neutro Abs 4.3  1.7 - 7.7 K/uL   Lymphocytes Relative 37  12 - 46 %   Lymphs Abs 3.1  0.7 - 4.0 K/uL   Monocytes Relative 10  3 - 12 %   Monocytes Absolute 0.9  0.1 - 1.0 K/uL   Eosinophils Relative 2  0 - 5 %   Eosinophils Absolute 0.2  0.0 - 0.7 K/uL   Basophils Relative 0  0 - 1 %   Basophils Absolute 0.0  0.0 - 0.1 K/uL  COMPREHENSIVE METABOLIC PANEL      Result Value Ref Range   Sodium 141  137 - 147 mEq/L   Potassium 4.2  3.7 - 5.3 mEq/L   Chloride 102  96 - 112 mEq/L   CO2 25  19 - 32 mEq/L   Glucose, Bld 82  70 - 99 mg/dL   BUN 13  6 - 23 mg/dL   Creatinine, Ser 7.820.99  0.50 - 1.35 mg/dL   Calcium 9.9  8.4 - 95.610.5 mg/dL   Total Protein 7.7  6.0 - 8.3 g/dL   Albumin 4.1  3.5 - 5.2 g/dL   AST 25  0 - 37 U/L   ALT 32  0 - 53 U/L   Alkaline Phosphatase 80  39 - 117 U/L   Total Bilirubin 0.4  0.3 - 1.2 mg/dL   GFR calc non Af Amer >90  >90 mL/min   GFR calc Af Amer >90  >90 mL/min  LIPASE, BLOOD      Result Value Ref Range   Lipase 26  11 - 59 U/L  URINALYSIS, ROUTINE W REFLEX  MICROSCOPIC      Result Value Ref Range   Color, Urine YELLOW  YELLOW  APPearance CLEAR  CLEAR   Specific Gravity, Urine 1.030  1.005 - 1.030   pH 6.0  5.0 - 8.0   Glucose, UA NEGATIVE  NEGATIVE mg/dL   Hgb urine dipstick NEGATIVE  NEGATIVE   Bilirubin Urine NEGATIVE  NEGATIVE   Ketones, ur NEGATIVE  NEGATIVE mg/dL   Protein, ur NEGATIVE  NEGATIVE mg/dL   Urobilinogen, UA 0.2  0.0 - 1.0 mg/dL   Nitrite NEGATIVE  NEGATIVE   Leukocytes, UA NEGATIVE  NEGATIVE      MDM   Final diagnoses:  Abdominal pain       Angus Seller, PA-C 06/01/13 [phone]

## 2013-06-01 NOTE — ED Notes (Addendum)
Pt left without being discharged. MD aware. Unable to obtain vitals. Pt was A&Ox4 upon leaving, NAD prior to departure.

## 2013-06-02 NOTE — ED Provider Notes (Signed)
Medical screening examination/treatment/procedure(s) were performed by non-physician practitioner and as supervising physician I was immediately available for consultation/collaboration.   Charnel Giles, MD 06/02/13 0541 

## 2013-10-18 ENCOUNTER — Emergency Department (HOSPITAL_COMMUNITY)
Admission: EM | Admit: 2013-10-18 | Discharge: 2013-10-19 | Disposition: A | Discharge status: Home / Self Care | Payer: Self-pay | Attending: Emergency Medicine | Admitting: Emergency Medicine

## 2013-10-18 DIAGNOSIS — Y9389 Activity, other specified: Secondary | ICD-10-CM | POA: Insufficient documentation

## 2013-10-18 DIAGNOSIS — F17200 Nicotine dependence, unspecified, uncomplicated: Secondary | ICD-10-CM | POA: Insufficient documentation

## 2013-10-18 DIAGNOSIS — Y9269 Other specified industrial and construction area as the place of occurrence of the external cause: Secondary | ICD-10-CM | POA: Insufficient documentation

## 2013-10-18 DIAGNOSIS — Z880 Allergy status to penicillin: Secondary | ICD-10-CM | POA: Insufficient documentation

## 2013-10-18 DIAGNOSIS — T1590XA Foreign body on external eye, part unspecified, unspecified eye, initial encounter: Secondary | ICD-10-CM | POA: Insufficient documentation

## 2013-10-18 DIAGNOSIS — J3489 Other specified disorders of nose and nasal sinuses: Secondary | ICD-10-CM | POA: Insufficient documentation

## 2013-10-18 DIAGNOSIS — Z79899 Other long term (current) drug therapy: Secondary | ICD-10-CM | POA: Insufficient documentation

## 2013-10-18 DIAGNOSIS — I10 Essential (primary) hypertension: Secondary | ICD-10-CM | POA: Insufficient documentation

## 2013-10-18 DIAGNOSIS — T1591XA Foreign body on external eye, part unspecified, right eye, initial encounter: Secondary | ICD-10-CM

## 2013-10-18 DIAGNOSIS — R51 Headache: Secondary | ICD-10-CM | POA: Insufficient documentation

## 2013-10-18 MED ORDER — HYDROCODONE-ACETAMINOPHEN 5-325 MG PO TABS: 1.0000 | ORAL_TABLET | ORAL | Status: DC | PRN

## 2013-10-18 MED ORDER — TETRACAINE HCL 0.5 % OP SOLN
2.0000 [drp] | Freq: Once | OPHTHALMIC | Status: AC
  Administered 2013-10-18: 2 [drp] via OPHTHALMIC
  Filled 2013-10-18: qty 2

## 2013-10-18 MED ORDER — FLUORESCEIN SODIUM 1 MG OP STRP
1.0000 | ORAL_STRIP | Freq: Once | OPHTHALMIC | Status: AC
  Administered 2013-10-18: 1 via OPHTHALMIC
  Filled 2013-10-18: qty 1

## 2013-10-18 MED ORDER — CIPROFLOXACIN HCL 0.3 % OP SOLN: 1.0000 [drp] | Freq: Four times a day (QID) | OPHTHALMIC | Status: DC

## 2013-10-18 NOTE — ED Provider Notes (Signed)
CSN: [id number]     Arrival date & time 10/18/13  2142 History  This chart was scribed for Trixie Dredge, PA-C working with Glynn Octave, MD by Evon Slack, ED Scribe. This patient was seen in room TR04C/TR04C and the patient's care was started at 11:05 PM.    Chief Complaint  Patient presents with  . Conjunctivitis   Patient is a 38 y.o. male presenting with conjunctivitis. The history is provided by the patient. No language interpreter was used.  Conjunctivitis Associated symptoms include headaches.   HPI Comments: Hunter Price is a 38 y.o. male who presents to the Emergency Department complaining of progressively worsening right eye pain onset today. He states he has associated headache and rhinorrhea. He states that he removed his contact immediately.  He states that the pain is intermittent like something is present under the eye lid. 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  At some point when he was wiping his face he started to feel the abnormal sensation in his right eye. He states he contacted his eye dr and was not able to be seen, was advised to use OTC eye drops - he used these without relief.  Denies any chemical exposure.  Denies working with metal.    Eye Dr. Michelle Piper  Past Medical History  Diagnosis Date  . Hypertension    Past Surgical History  Procedure Laterality Date  . Wisdom tooth extraction Bilateral 2005    x4   No family history on file. History  Substance Use Topics  . Smoking status: Current Every Day Smoker -- 1.00 packs/day for 10 years    Types: Cigarettes  . Smokeless tobacco: Never Used  . Alcohol Use: Yes     Comment: occ.    Review of Systems  HENT: Positive for rhinorrhea.   Eyes: Positive for pain and redness.  Neurological: Positive for headaches.  All other systems reviewed and are negative.     Allergies  Amoxicillin and  Penicillins  Home Medications   Prior to Admission medications   Medication Sig Start Date End Date Taking? Authorizing Provider  calcium carbonate (TUMS - DOSED IN MG ELEMENTAL CALCIUM) 500 MG chewable tablet Chew 1 tablet by mouth daily as needed for indigestion or heartburn (indigestion).    Historical Provider, MD  EPINEPHrine (EPIPEN) 0.3 mg/0.3 mL DEVI Inject 0.3 mLs (0.3 mg total) into the muscle as needed. 05/21/11   Loren Racer, MD  omeprazole (PRILOSEC) 40 MG capsule Take 1 capsule (40 mg total) by mouth daily. 04/26/13   Rodolph Bong, MD   Triage Vitals: BP 162/88  Pulse 70  Temp(Src) 98.1 F (36.7 C) (Oral)  Ht 5\' 9"  (1.753 m)  Wt 242 lb (109.77 kg)  BMI 35.72 kg/m2  SpO2 96%  Physical Exam  Nursing note and vitals reviewed. Constitutional: He appears well-developed and well-nourished. No distress.  HENT:  Head: Normocephalic and atraumatic.  Eyes: EOM and lids are normal. Pupils are equal, round, and reactive to light. Lids are everted and swept, no foreign bodies found. Right conjunctiva is injected.  Slit lamp exam:      The right eye shows no corneal abrasion and no fluorescein uptake.       The left eye shows no fluorescein uptake.  Small foreign body removed on exam  Neck: Neck supple.  Pulmonary/Chest: Effort normal.  Neurological: He is  alert.  Skin: He is not diaphoretic.    ED Course  Procedures (including critical care time) DIAGNOSTIC STUDIES: Oxygen Saturation is 96% on RA, adequate by my interpretation.    COORDINATION OF CARE:    Labs Review Labs Reviewed - No data to display  Imaging Review No results found.   EKG Interpretation None      MDM   Final diagnoses:  Eye foreign body, right, initial encounter    Pt with small FB in right eye with injected eye.  No corneal abrasion on exam.  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  Pt advised to not wear his contacts and follow up with his eye  doctor.  D/C home with norco and cipro ophth drops.  Discussed findings, treatment, and follow up  with patient.  Pt given return precautions.  Pt verbalizes understanding and agrees with plan.       I personally performed the services described in this documentation, which was scribed in my presence. The recorded information has been reviewed and is accurate.      GastonEmily Blaise Palladino, PA-C 10/19/13 [phone]

## 2013-10-18 NOTE — ED Notes (Signed)
Pts. Rt. Eye: conjunctivitis, red. Called eye dr. And was to take otc refresh  eye ointment. "feels like something is in upper eye."

## 2013-10-18 NOTE — Discharge Instructions (Signed)
Read the information below.  Use the prescribed medication as directed.  Please discuss all new medications with your pharmacist.  Do not take additional tylenol while taking the prescribed pain medication to avoid overdose.  You may return to the Emergency Department at any time for worsening condition or any new symptoms that concern you.   If you develop worsening pain in your eye, change in your vision, swelling around your eye, difficulty moving your eye, or fevers greater than 100.4, see your eye doctor or return to the Emergency Department immediately for a recheck.       Eye, Foreign Body The term foreign body refers to any object near, on the surface of or in the eye that should not be there. A foreign body may be a small speck of dirt or dust, a hair or eyelash, a splinter or any object. CAUSES  Foreign bodies can get in the eye by:  Flying pieces of something that was broken or destroyed (debris).  A sudden injury (trauma) to the eye. SYMPTOMS  Symptoms depend on what the foreign body is and where it is in the eye. The most common locations are:  On the inner surface of the upper or lower eyelids or on the covering of the white part of the eye (conjunctiva). Symptoms in this location are:  Irritating and painful, especially when blinking.  Feeling like something is in the eye.  On the surface of the clear covering on the front of the eye (cornea). A corneal foreign body has symptoms that:  Are painful and irritating since the cornea is very sensitive.  Form small "rust rings" around a metallic foreign body. Metallic foreign bodies stick more firmly to the surface of the cornea.  Inside the eyeball. Infection can happen fast and can be hard to treat with antibiotics. This is an extremely dangerous situation. Foreign bodies inside the eye can threaten vision. A person may even loose their eye. Foreign bodies inside the eye may cause:  Great pain.  Immediate loss of  vision. DIAGNOSIS  Foreign bodies are found during an exam by an eye specialist. Those that are on the eyelids, conjunctiva or cornea are usually (but not always) easily found. When a foreign body is inside the eyeball, a cataract may form almost right away. This makes it hard for an ophthalmologist to find the foreign body. Special tests may be needed, including ultrasound testing, X-rays and CT scans. TREATMENT   Foreign bodies that are on the eyelids, conjunctiva or cornea are often removed easily and painlessly.  If the foreign body has caused a scratch or abrasion of the cornea, antibiotic drops, ointments and/or a tight patch called a "pressure patch" may be needed. Follow-up exams will be needed for several days until the abrasion heals.  Surgery is needed right away if the foreign body is inside the eyeball. This is a medical emergency. An antibiotic therapy will likely be given to stop an infection. HOME CARE INSTRUCTIONS  The use of eye patches is not universal. Their use varies from state to state and from caregiver to caregiver. If an eye patch was applied:  Keep the eye patch on for as long as directed by your caregiver until the follow-up appointment.  Do not remove the patch to put in medications unless instructed to do so. When replacing the patch, retape it as it was before. Follow the same procedure if the patch becomes loose.  WARNING: Do not drive or operate machinery while the  eye is patched. The ability to judge distances will be impaired.  Only take over-the-counter or prescription medicines for pain, discomfort or fever as directed by the caregiver. If no eye patch was applied:  Keep the eye closed as much as possible. Do not rub the eye.  Wear dark glasses as needed to protect the eyes from bright light.  Do not wear contact lenses until the eye feels normal again, or as instructed.  Wear protective eye covering if there is a risk of eye injury. This is important  when working with high speed tools.  Only take over-the-counter or prescription medicines for pain, discomfort or fever as directed by the caregiver. SEEK IMMEDIATE MEDICAL CARE IF:   Pain increases in the eye or the vision changes.  You or your child has problems with the eye patch.  The injury to the eye appears to be getting larger.  There is discharge from the injured eye.  Swelling and/or soreness (inflammation) develops around the affected eye.  You or your child has an oral temperature above 102 F (38.9 C), not controlled by medicine.  Your baby is older than 3 months with a rectal temperature of 102 F (38.9 C) or higher.  Your baby is 65 months old or younger with a rectal temperature of 100.4 F (38 C) or higher. MAKE SURE YOU:   Understand these instructions.  Will watch your condition.  Will get help right away if you are not doing well or get worse. Document Released: 03/28/2005 Document Revised: 06/20/2011 Document Reviewed: 08/23/2012 [facility] Patient Information 2015 [place], [place]. This information is not intended to replace advice given to you by your health care provider. Make sure you discuss any questions you have with your health care provider.

## 2013-10-19 NOTE — ED Provider Notes (Signed)
Medical screening examination/treatment/procedure(s) were performed by non-physician practitioner and as supervising physician I was immediately available for consultation/collaboration.   EKG Interpretation None        Calene Paradiso, MD 10/19/13 0105 

## 2014-02-10 ENCOUNTER — Emergency Department (HOSPITAL_COMMUNITY)
Admission: EM | Admit: 2014-02-10 | Discharge: 2014-02-10 | Disposition: A | Discharge status: Home / Self Care | Payer: Self-pay | Attending: Emergency Medicine | Admitting: Emergency Medicine

## 2014-02-10 ENCOUNTER — Emergency Department (HOSPITAL_COMMUNITY): Payer: Self-pay

## 2014-02-10 ENCOUNTER — Encounter (HOSPITAL_COMMUNITY): Payer: Self-pay | Admitting: Emergency Medicine

## 2014-02-10 DIAGNOSIS — Y9389 Activity, other specified: Secondary | ICD-10-CM | POA: Insufficient documentation

## 2014-02-10 DIAGNOSIS — Z880 Allergy status to penicillin: Secondary | ICD-10-CM | POA: Insufficient documentation

## 2014-02-10 DIAGNOSIS — Z79899 Other long term (current) drug therapy: Secondary | ICD-10-CM | POA: Insufficient documentation

## 2014-02-10 DIAGNOSIS — Z791 Long term (current) use of non-steroidal anti-inflammatories (NSAID): Secondary | ICD-10-CM | POA: Insufficient documentation

## 2014-02-10 DIAGNOSIS — S86101A Unspecified injury of other muscle(s) and tendon(s) of posterior muscle group at lower leg level, right leg, initial encounter: Secondary | ICD-10-CM | POA: Insufficient documentation

## 2014-02-10 DIAGNOSIS — X58XXXA Exposure to other specified factors, initial encounter: Secondary | ICD-10-CM | POA: Insufficient documentation

## 2014-02-10 DIAGNOSIS — I10 Essential (primary) hypertension: Secondary | ICD-10-CM | POA: Insufficient documentation

## 2014-02-10 DIAGNOSIS — Z720 Tobacco use: Secondary | ICD-10-CM | POA: Insufficient documentation

## 2014-02-10 DIAGNOSIS — R52 Pain, unspecified: Secondary | ICD-10-CM

## 2014-02-10 DIAGNOSIS — Y929 Unspecified place or not applicable: Secondary | ICD-10-CM | POA: Insufficient documentation

## 2014-02-10 DIAGNOSIS — Z792 Long term (current) use of antibiotics: Secondary | ICD-10-CM | POA: Insufficient documentation

## 2014-02-10 DIAGNOSIS — S86912A Strain of unspecified muscle(s) and tendon(s) at lower leg level, left leg, initial encounter: Secondary | ICD-10-CM

## 2014-02-10 MED ORDER — MELOXICAM 7.5 MG PO TABS: 15.0000 mg | ORAL_TABLET | Freq: Every day | ORAL | Status: DC

## 2014-02-10 MED ORDER — TRAMADOL HCL 50 MG PO TABS: 50.0000 mg | ORAL_TABLET | Freq: Four times a day (QID) | ORAL | Status: DC | PRN

## 2014-02-10 NOTE — ED Notes (Signed)
Pt. reports left knee pain onset 4 days ago , denies injury or fall , ambulatory.

## 2014-02-10 NOTE — ED Notes (Signed)
Pt A&OX4, NAD 

## 2014-02-10 NOTE — ED Provider Notes (Signed)
CSN: [id number]     Arrival date & time 02/10/14  2042 History   First MD Initiated Contact with Patient 02/10/14 2218     Chief Complaint  Patient presents with  . Knee Pain    (Consider location/radiation/quality/duration/timing/severity/associated sxs/prior Treatment) HPI Comments: 38 year old male with a history of hypertension presents to the emergency department for left knee pain. Patient states that he was walking 4 days ago when his left knee gave out. He states he has been having constant pain to the lateral aspect of his left knee since this time which has been worsening. He has been taking ibuprofen for symptoms without relief. Pain is worse with prolonged rest as well as ambulation and weightbearing. Symptoms associated with mild swelling. Patient denies associated fever, redness, numbness, weakness, or direct trauma or injury; no falls.  Patient is a 38 y.o. male presenting with knee pain. The history is provided by the patient. No language interpreter was used.  Knee Pain   Past Medical History  Diagnosis Date  . Hypertension    Past Surgical History  Procedure Laterality Date  . Wisdom tooth extraction Bilateral 2005    x4   No family history on file. History  Substance Use Topics  . Smoking status: Current Every Day Smoker -- 1.00 packs/day for 10 years    Types: Cigarettes  . Smokeless tobacco: Never Used  . Alcohol Use: Yes     Comment: occ.    Review of Systems  Musculoskeletal: Positive for joint swelling and arthralgias.  All other systems reviewed and are negative.   Allergies  Amoxicillin and Penicillins  Home Medications   Prior to Admission medications   Medication Sig Start Date End Date Taking? Authorizing Provider  calcium carbonate (TUMS - DOSED IN MG ELEMENTAL CALCIUM) 500 MG chewable tablet Chew 1 tablet by mouth daily as needed for indigestion or heartburn (indigestion).    Historical Provider, MD  ciprofloxacin (CILOXAN) 0.3 %  ophthalmic solution Place 1-2 drops into the left eye 4 (four) times daily. X 5 days 10/18/13   Trixie DredgeEmily West, PA-C  EPINEPHrine (EPIPEN) 0.3 mg/0.3 mL DEVI Inject 0.3 mLs (0.3 mg total) into the muscle as needed. 05/21/11   Loren Raceravid Yelverton, MD  HYDROcodone-acetaminophen (NORCO/VICODIN) 5-325 MG per tablet Take 1-2 tablets by mouth every 4 (four) hours as needed for moderate pain or severe pain. 10/18/13   Trixie DredgeEmily West, PA-C  meloxicam (MOBIC) 7.5 MG tablet Take 2 tablets (15 mg total) by mouth daily. 02/10/14   Antony MaduraKelly Karsyn Jamie, PA-C  omeprazole (PRILOSEC) 40 MG capsule Take 1 capsule (40 mg total) by mouth daily. 04/26/13   Rodolph BongEvan S Corey, MD  traMADol (ULTRAM) 50 MG tablet Take 1 tablet (50 mg total) by mouth every 6 (six) hours as needed. 02/10/14   Antony MaduraKelly Tannie Koskela, PA-C   BP 147/97 mmHg  Pulse 89  Temp(Src) 97.8 F (36.6 C) (Oral)  Resp 18  Ht 5\' 9"  (1.753 m)  Wt 255 lb (115.667 kg)  BMI 37.64 kg/m2  SpO2 97%   Physical Exam  Constitutional: He is oriented to person, place, and time. He appears well-developed and well-nourished. No distress.  HENT:  Head: Normocephalic and atraumatic.  Eyes: Conjunctivae and EOM are normal. No scleral icterus.  Neck: Normal range of motion.  Cardiovascular: Normal rate, regular rhythm and intact distal pulses.   DP and PT pulses 2+ in left lower extremity.  Pulmonary/Chest: Effort normal. No respiratory distress.  Musculoskeletal: Normal range of motion. He exhibits tenderness.  Left knee: He exhibits swelling. He exhibits normal range of motion, no effusion, no erythema, normal alignment, no LCL laxity, no bony tenderness and no MCL laxity. Tenderness found.  Tenderness to the left knee just superior and lateral to the patella. There is mild suprapatellar swelling without effusion. No crepitus or deformity. No laxity. No erythema, heat to touch, or red streaking. Strength 5/5 with knee flexion and extension against resistance.  Neurological: He is alert and oriented  to person, place, and time. He exhibits normal muscle tone. Coordination normal.  Sensation to light touch intact. Patient able to weight-bear without assistance. He ambulates with antalgic gait.  Skin: Skin is warm and dry. No rash noted. He is not diaphoretic. No erythema. No pallor.  Psychiatric: He has a normal mood and affect. His behavior is normal.  Nursing note and vitals reviewed.   ED Course  Procedures (including critical care time) Labs Review Labs Reviewed - No data to display  Imaging Review Dg Knee Complete 4 Views Left  02/10/2014   CLINICAL DATA:  Left knee pain today.  No known injury.  EXAM: LEFT KNEE - COMPLETE 4+ VIEW  COMPARISON:  None.  FINDINGS: There is no evidence of fracture, dislocation, or joint effusion. There is no evidence of arthropathy or other focal bone abnormality. Soft tissues are unremarkable.  IMPRESSION: Normal examination.   Electronically Signed   By: Gordan PaymentSteve  Reid M.D.   On: 02/10/2014 21:24     EKG Interpretation None      MDM   Final diagnoses:  Knee strain, left, initial encounter    38 year old male presents to the emergency department for persistent left knee pain after his knee gave out 4 days ago. Patient is neurovascularly intact. No sensory deficits appreciated. No findings consistent with septic joint.imaging today negative for fracture, dislocation, or bony deformity. No joint effusion appreciated on imaging. Symptoms consistent with strain of knee. Patient given knee sleeve in ED and crutches for weightbearing as tolerated. He will be discharged with RICE instruction as well as prescription for Mobic. Orthopedic referral provided should symptoms persist and return precautions discussed. Patient agreeable to plan with no unaddressed concerns.   Filed Vitals:   02/10/14 2047 02/10/14 2245  BP: 159/89 147/97  Pulse: 98 89  Temp: 97.8 F (36.6 C)   TempSrc: Oral   Resp: 22 18  Height: 5\' 9"  (1.753 m)   Weight: 255 lb (115.667  kg)   SpO2: 96% 97%     Antony MaduraKelly Meya Clutter, PA-C 02/10/14 2300

## 2014-02-10 NOTE — Discharge Instructions (Signed)
Recommend knee sleeve and crutches to bear weight on your leg as tolerated. Take Motrin as prescribed and apply ice to your knee 3-4 times per day. You may also alternate ice and heat if desired. Refrain from using heat only. You may take tramadol as needed for severe pain. Follow-up with orthopedics if symptoms persist.  Knee Pain The knee is the complex joint between your thigh and your lower leg. It is made up of bones, tendons, ligaments, and cartilage. The bones that make up the knee are:  The femur in the thigh.  The tibia and fibula in the lower leg.  The patella or kneecap riding in the groove on the lower femur. CAUSES  Knee pain is a common complaint with many causes. A few of these causes are:  Injury, such as:  A ruptured ligament or tendon injury.  Torn cartilage.  Medical conditions, such as:  Gout  Arthritis  Infections  Overuse, over training, or overdoing a physical activity. Knee pain can be minor or severe. Knee pain can accompany debilitating injury. Minor knee problems often respond well to self-care measures or get well on their own. More serious injuries may need medical intervention or even surgery. SYMPTOMS The knee is complex. Symptoms of knee problems can vary widely. Some of the problems are:  Pain with movement and weight bearing.  Swelling and tenderness.  Buckling of the knee.  Inability to straighten or extend your knee.  Your knee locks and you cannot straighten it.  Warmth and redness with pain and fever.  Deformity or dislocation of the kneecap. DIAGNOSIS  Determining what is wrong may be very straight forward such as when there is an injury. It can also be challenging because of the complexity of the knee. Tests to make a diagnosis may include:  Your caregiver taking a history and doing a physical exam.  Routine X-rays can be used to rule out other problems. X-rays will not reveal a cartilage tear. Some injuries of the knee can be  diagnosed by:  Arthroscopy a surgical technique by which a small video camera is inserted through tiny incisions on the sides of the knee. This procedure is used to examine and repair internal knee joint problems. Tiny instruments can be used during arthroscopy to repair the torn knee cartilage (meniscus).  Arthrography is a radiology technique. A contrast liquid is directly injected into the knee joint. Internal structures of the knee joint then become visible on X-ray film.  An MRI scan is a non X-ray radiology procedure in which magnetic fields and a computer produce two- or three-dimensional images of the inside of the knee. Cartilage tears are often visible using an MRI scanner. MRI scans have largely replaced arthrography in diagnosing cartilage tears of the knee.  Blood work.  Examination of the fluid that helps to lubricate the knee joint (synovial fluid). This is done by taking a sample out using a needle and a syringe. TREATMENT The treatment of knee problems depends on the cause. Some of these treatments are:  Depending on the injury, proper casting, splinting, surgery, or physical therapy care will be needed.  Give yourself adequate recovery time. Do not overuse your joints. If you begin to get sore during workout routines, back off. Slow down or do fewer repetitions.  For repetitive activities such as cycling or running, maintain your strength and nutrition.  Alternate muscle groups. For example, if you are a weight lifter, work the upper body on one day and the lower  body the next.  Either tight or weak muscles do not give the proper support for your knee. Tight or weak muscles do not absorb the stress placed on the knee joint. Keep the muscles surrounding the knee strong.  Take care of mechanical problems.  If you have flat feet, orthotics or special shoes may help. See your caregiver if you need help.  Arch supports, sometimes with wedges on the inner or outer aspect of  the heel, can help. These can shift pressure away from the side of the knee most bothered by osteoarthritis.  A brace called an "unloader" brace also may be used to help ease the pressure on the most arthritic side of the knee.  If your caregiver has prescribed crutches, braces, wraps or ice, use as directed. The acronym for this is PRICE. This means protection, rest, ice, compression, and elevation.  Nonsteroidal anti-inflammatory drugs (NSAIDs), can help relieve pain. But if taken immediately after an injury, they may actually increase swelling. Take NSAIDs with food in your stomach. Stop them if you develop stomach problems. Do not take these if you have a history of ulcers, stomach pain, or bleeding from the bowel. Do not take without your caregiver's approval if you have problems with fluid retention, heart failure, or kidney problems.  For ongoing knee problems, physical therapy may be helpful.  Glucosamine and chondroitin are over-the-counter dietary supplements. Both may help relieve the pain of osteoarthritis in the knee. These medicines are different from the usual anti-inflammatory drugs. Glucosamine may decrease the rate of cartilage destruction.  Injections of a corticosteroid drug into your knee joint may help reduce the symptoms of an arthritis flare-up. They may provide pain relief that lasts a few months. You may have to wait a few months between injections. The injections do have a small increased risk of infection, water retention, and elevated blood sugar levels.  Hyaluronic acid injected into damaged joints may ease pain and provide lubrication. These injections may work by reducing inflammation. A series of shots may give relief for as long as 6 months.  Topical painkillers. Applying certain ointments to your skin may help relieve the pain and stiffness of osteoarthritis. Ask your pharmacist for suggestions. Many over the-counter products are approved for temporary relief of  arthritis pain.  In some countries, doctors often prescribe topical NSAIDs for relief of chronic conditions such as arthritis and tendinitis. A review of treatment with NSAID creams found that they worked as well as oral medications but without the serious side effects. PREVENTION  Maintain a healthy weight. Extra pounds put more strain on your joints.  Get strong, stay limber. Weak muscles are a common cause of knee injuries. Stretching is important. Include flexibility exercises in your workouts.  Be smart about exercise. If you have osteoarthritis, chronic knee pain or recurring injuries, you may need to change the way you exercise. This does not mean you have to stop being active. If your knees ache after jogging or playing basketball, consider switching to swimming, water aerobics, or other low-impact activities, at least for a few days a week. Sometimes limiting high-impact activities will provide relief.  Make sure your shoes fit well. Choose footwear that is right for your sport.  Protect your knees. Use the proper gear for knee-sensitive activities. Use kneepads when playing volleyball or laying carpet. Buckle your seat belt every time you drive. Most shattered kneecaps occur in car accidents.  Rest when you are tired. SEEK MEDICAL CARE IF:  You have  knee pain that is continual and does not seem to be getting better.  SEEK IMMEDIATE MEDICAL CARE IF:  Your knee joint feels hot to the touch and you have a high fever. MAKE SURE YOU:   Understand these instructions.  Will watch your condition.  Will get help right away if you are not doing well or get worse. Document Released: 01/23/2007 Document Revised: 06/20/2011 Document Reviewed: 01/23/2007 [facility] Patient Information 2015 [place], [place]. This information is not intended to replace advice given to you by your health care provider. Make sure you discuss any questions you have with your health care provider.  RICE: Routine  Care for Injuries The routine care of many injuries includes Rest, Ice, Compression, and Elevation (RICE). HOME CARE INSTRUCTIONS  Rest is needed to allow your body to heal. Routine activities can usually be resumed when comfortable. Injured tendons and bones can take up to 6 weeks to heal. Tendons are the cord-like structures that attach muscle to bone.  Ice following an injury helps keep the swelling down and reduces pain.  Put ice in a plastic bag.  Place a towel between your skin and the bag.  Leave the ice on for 15-20 minutes, 3-4 times a day, or as directed by your health care provider. Do this while awake, for the first 24 to 48 hours. After that, continue as directed by your caregiver.  Compression helps keep swelling down. It also gives support and helps with discomfort. If an elastic bandage has been applied, it should be removed and reapplied every 3 to 4 hours. It should not be applied tightly, but firmly enough to keep swelling down. Watch fingers or toes for swelling, bluish discoloration, coldness, numbness, or excessive pain. If any of these problems occur, remove the bandage and reapply loosely. Contact your caregiver if these problems continue.  Elevation helps reduce swelling and decreases pain. With extremities, such as the arms, hands, legs, and feet, the injured area should be placed near or above the level of the heart, if possible. SEEK IMMEDIATE MEDICAL CARE IF:  You have persistent pain and swelling.  You develop redness, numbness, or unexpected weakness.  Your symptoms are getting worse rather than improving after several days. These symptoms may indicate that further evaluation or further X-rays are needed. Sometimes, X-rays may not show a small broken bone (fracture) until 1 week or 10 days later. Make a follow-up appointment with your caregiver. Ask when your X-ray results will be ready. Make sure you get your X-ray results. Document Released: 07/10/2000  Document Revised: 04/02/2013 Document Reviewed: 08/27/2010 [facility] Patient Information 2015 [place], [place]. This information is not intended to replace advice given to you by your health care provider. Make sure you discuss any questions you have with your health care provider.

## 2014-06-23 ENCOUNTER — Encounter (HOSPITAL_COMMUNITY): Payer: Self-pay | Admitting: Emergency Medicine

## 2014-06-23 ENCOUNTER — Emergency Department (HOSPITAL_COMMUNITY)
Admission: EM | Admit: 2014-06-23 | Discharge: 2014-06-23 | Disposition: A | Discharge status: Home / Self Care | Payer: Self-pay | Attending: Emergency Medicine | Admitting: Emergency Medicine

## 2014-06-23 DIAGNOSIS — S0502XA Injury of conjunctiva and corneal abrasion without foreign body, left eye, initial encounter: Secondary | ICD-10-CM | POA: Insufficient documentation

## 2014-06-23 DIAGNOSIS — Y998 Other external cause status: Secondary | ICD-10-CM | POA: Insufficient documentation

## 2014-06-23 DIAGNOSIS — X58XXXA Exposure to other specified factors, initial encounter: Secondary | ICD-10-CM | POA: Insufficient documentation

## 2014-06-23 DIAGNOSIS — Z79899 Other long term (current) drug therapy: Secondary | ICD-10-CM | POA: Insufficient documentation

## 2014-06-23 DIAGNOSIS — Z792 Long term (current) use of antibiotics: Secondary | ICD-10-CM | POA: Insufficient documentation

## 2014-06-23 DIAGNOSIS — Y9389 Activity, other specified: Secondary | ICD-10-CM | POA: Insufficient documentation

## 2014-06-23 DIAGNOSIS — Y9289 Other specified places as the place of occurrence of the external cause: Secondary | ICD-10-CM | POA: Insufficient documentation

## 2014-06-23 DIAGNOSIS — Z880 Allergy status to penicillin: Secondary | ICD-10-CM | POA: Insufficient documentation

## 2014-06-23 DIAGNOSIS — Z720 Tobacco use: Secondary | ICD-10-CM | POA: Insufficient documentation

## 2014-06-23 DIAGNOSIS — Z791 Long term (current) use of non-steroidal anti-inflammatories (NSAID): Secondary | ICD-10-CM | POA: Insufficient documentation

## 2014-06-23 DIAGNOSIS — I10 Essential (primary) hypertension: Secondary | ICD-10-CM | POA: Insufficient documentation

## 2014-06-23 MED ORDER — TETRACAINE HCL 0.5 % OP SOLN
1.0000 [drp] | Freq: Once | OPHTHALMIC | Status: AC
  Administered 2014-06-23: 2 [drp] via OPHTHALMIC
  Filled 2014-06-23: qty 2

## 2014-06-23 MED ORDER — OXYCODONE-ACETAMINOPHEN 5-325 MG PO TABS
1.0000 | ORAL_TABLET | Freq: Once | ORAL | Status: AC
  Administered 2014-06-23: 1 via ORAL
  Filled 2014-06-23: qty 1

## 2014-06-23 MED ORDER — OXYCODONE-ACETAMINOPHEN 5-325 MG PO TABS: 2.0000 | ORAL_TABLET | ORAL | Status: DC | PRN

## 2014-06-23 MED ORDER — FLUORESCEIN SODIUM 1 MG OP STRP
1.0000 | ORAL_STRIP | Freq: Once | OPHTHALMIC | Status: AC
  Administered 2014-06-23: 1 via OPHTHALMIC
  Filled 2014-06-23: qty 1

## 2014-06-23 MED ORDER — OFLOXACIN 0.3 % OP SOLN: 1.0000 [drp] | OPHTHALMIC | Status: DC

## 2014-06-23 NOTE — ED Notes (Signed)
Pt reports last night his L eye started burning and aching. Pt wears contacts. Eye appears red and pt has difficulty keeping eye open. Hx corneal ulcer. Also reports headache and sinus pressure/drainage.

## 2014-06-23 NOTE — Discharge Instructions (Signed)
Corneal Abrasion The cornea is the clear covering at the front and center of the eye. When looking at the colored portion of the eye (iris), you are looking through the cornea. This very thin tissue is made up of many layers. The surface layer is a single layer of cells (corneal epithelium) and is one of the most sensitive tissues in the body. If a scratch or injury causes the corneal epithelium to come off, it is called a corneal abrasion. If the injury extends to the tissues below the epithelium, the condition is called a corneal ulcer. CAUSES   Scratches.  Trauma.  Foreign body in the eye. Some people have recurrences of abrasions in the area of the original injury even after it has healed (recurrent erosion syndrome). Recurrent erosion syndrome generally improves and goes away with time. SYMPTOMS   Eye pain.  Difficulty or inability to keep the injured eye open.  The eye becomes very sensitive to light.  Recurrent erosions tend to happen suddenly, first thing in the morning, usually after waking up and opening the eye. DIAGNOSIS  Your health care provider can diagnose a corneal abrasion during an eye exam. Dye is usually placed in the eye using a drop or a small paper strip moistened by your tears. When the eye is examined with a special light, the abrasion shows up clearly because of the dye. TREATMENT   Small abrasions may be treated with antibiotic drops or ointment alone.  A pressure patch may be put over the eye. If this is done, follow your doctor's instructions for when to remove the patch. Do not drive or use machines while the eye patch is on. Judging distances is hard to do with a patch on. If the abrasion becomes infected and spreads to the deeper tissues of the cornea, a corneal ulcer can result. This is serious because it can cause corneal scarring. Corneal scars interfere with light passing through the cornea and cause a loss of vision in the involved eye. HOME CARE  INSTRUCTIONS  Use medicine or ointment as directed. Only take over-the-counter or prescription medicines for pain, discomfort, or fever as directed by your health care provider.  Do not drive or operate machinery if your eye is patched. Your ability to judge distances is impaired.  If your health care provider has given you a follow-up appointment, it is very important to keep that appointment. Not keeping the appointment could result in a severe eye infection or permanent loss of vision. If there is any problem keeping the appointment, let your health care provider know. SEEK MEDICAL CARE IF:   You have pain, light sensitivity, and a scratchy feeling in one eye or both eyes.  Your pressure patch keeps loosening up, and you can blink your eye under the patch after treatment.  Any kind of discharge develops from the eye after treatment or if the lids stick together in the morning.  You have the same symptoms in the morning as you did with the original abrasion days, weeks, or months after the abrasion healed. MAKE SURE YOU:   Understand these instructions.  Will watch your condition.  Will get help right away if you are not doing well or get worse. Document Released: 03/25/2000 Document Revised: 04/02/2013 Document Reviewed: 12/03/2012 [facility] Patient Information 2015 [place], [place]. This information is not intended to replace advice given to you by your health care provider. Make sure you discuss any questions you have with your health care provider.  Please use  antibiotics as directed and 600 mg ibuprofen as needed for pain. Percocet as needed for break through pain. Do not use contact lenses, avoid itching eye, and follow-up tomorrow morning with Dr. Dione BoozeGroat. Return immediately if new or worsening symptoms presents.

## 2014-06-23 NOTE — ED Provider Notes (Signed)
CSN: [id number]     Arrival date & time 06/23/14  1513 History  This chart was scribed for non-physician practitioner, Eyvonne Mechanic, working with Rolan Bucco, MD by Richarda Overlie, ED Scribe. This patient was seen in room TR04C/TR04C and the patient's care was started at 4:39 PM.   Chief Complaint  Patient presents with  . Eye Pain   HPI HPI Comments: Hunter Price is a 39 y.o. male with a history of HTN who presents to the Emergency Department complaining of left eye pain that started last night at 1AM. He says that he experienced some similar symptoms 3 days ago that were less severe and self resolved. Pt states he woke up with left eye pain that he describes as a burning sensation and says his eye was watering. He reports that he wears contacts and only takes them out at night. He states that he had no symptoms when he went to bed or when he took his contacts out. Pt states he put his contact back in and says his pain somewhat resolved. Pt states that his vision was normal yesterday and reports no symptoms. Pt reports his vision is hazy at this time and reports he has an associated left-sided HA that he describes as a pressure. He reports associated photophobia as well. Pt states that keeping his eye shut and wearing his contact improves his pain. 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 He denies sore throat, cough, fever, nausea, vomiting, CP and diarrhea. Pt denies any numbness, weakness or any changes in his smell or taste.  Pt works in the [employer] but denies recollection of foreign body.   Past Medical History  Diagnosis Date  . Hypertension    Past Surgical History  Procedure Laterality Date  . Wisdom tooth extraction Bilateral 2005    x4   No family history on file. History  Substance Use Topics  . Smoking status: Current Every  Day Smoker -- 1.00 packs/day for 10 years    Types: Cigarettes  . Smokeless tobacco: Never Used  . Alcohol Use: Yes     Comment: occ.    Review of Systems  Constitutional: Negative for fever.  HENT: Negative for sore throat.   Eyes: Positive for photophobia, pain, redness and visual disturbance.  Respiratory: Negative for cough.   Cardiovascular: Negative for chest pain.  Gastrointestinal: Negative for nausea, vomiting and diarrhea.  Neurological: Positive for headaches. Negative for weakness and numbness.  All other systems reviewed and are negative.   Allergies  Amoxicillin and Penicillins  Home Medications   Prior to Admission medications   Medication Sig Start Date End Date Taking? Authorizing Provider  calcium carbonate (TUMS - DOSED IN MG ELEMENTAL CALCIUM) 500 MG chewable tablet Chew 1 tablet by mouth daily as needed for indigestion or heartburn (indigestion).    Historical Provider, MD  ciprofloxacin (CILOXAN) 0.3 % ophthalmic solution Place 1-2 drops into the left eye 4 (four) times daily. X 5 days 10/18/13   Trixie Dredge, PA-C  EPINEPHrine (EPIPEN) 0.3 mg/0.3 mL DEVI Inject 0.3 mLs (0.3 mg total) into the muscle as needed. 05/21/11   Loren Racer, MD  HYDROcodone-acetaminophen (NORCO/VICODIN) 5-325 MG per tablet Take 1-2 tablets by mouth every 4 (four) hours as needed for moderate pain or severe pain. 10/18/13   Trixie Dredge, PA-C  meloxicam (  MOBIC) 7.5 MG tablet Take 2 tablets (15 mg total) by mouth daily. 02/10/14   Antony MaduraKelly Humes, PA-C  omeprazole (PRILOSEC) 40 MG capsule Take 1 capsule (40 mg total) by mouth daily. 04/26/13   Rodolph BongEvan S Corey, MD  traMADol (ULTRAM) 50 MG tablet Take 1 tablet (50 mg total) by mouth every 6 (six) hours as needed. 02/10/14   Antony MaduraKelly Humes, PA-C   BP 154/85 mmHg  Pulse 76  Temp(Src) 98.3 F (36.8 C) (Oral)  Resp 16  Ht 5\' 9"  (1.753 m)  Wt 273 lb 4 oz (123.945 kg)  BMI 40.33 kg/m2  SpO2 97% Physical Exam  Constitutional: He is oriented to person,  place, and time. He appears well-developed and well-nourished.  HENT:  Head: Normocephalic and atraumatic.  Periorbital tenderness and swelling. Swelling of the eyelids. Lateral cervical muscle tenderness.   Eyes:  Extraocular movements are painful.palpebral, bulbar injection. Eyelid edema. Surrounding soft tissue infection surrounding eye. No erythema. Clear discharge. Tenderness to palpation of periorbital soft tissue, masseter, and cervical muscles.   Neck: No tracheal deviation present.  Cardiovascular: Normal rate.   Pulmonary/Chest: Effort normal. No respiratory distress.  Abdominal: He exhibits no distension.  Neurological: He is alert and oriented to person, place, and time.  Skin: Skin is warm and dry.  Psychiatric: He has a normal mood and affect. His behavior is normal.  Nursing note and vitals reviewed.   ED Course  Procedures   DIAGNOSTIC STUDIES: Oxygen Saturation is 97% on RA, normal by my interpretation.    COORDINATION OF CARE: 4:49 PM Discussed treatment plan with pt at bedside and pt agreed to plan.   Labs Review Labs Reviewed - No data to display  Imaging Review No results found.   EKG Interpretation None      MDM   Final diagnoses:  None   Imaging:   Fluorescein: multiple corneal abrasions. One area of questionable foreign body  Consults: Opthalmology- Dr. Dione BoozeGroat    Therapeutics: Percocet, tetracaine, fluorescein   Assessment: Fluorescin stain revealed multiple superficial corneal abrasions with an area of questionable foreign body. The eye was examined using a slit lamp showing the abrasions. Patient's pain was managed using tetracaine eyedrops and Percocet oral. Dr. Dione BoozeGroat was consulted and instructed us to initiate ophthalmic antibiotic therapy DC contact use, and follow up with him tomorrow morning. Patient understood and agreed to this plan and assured his follow-up. At the time of discharge his condition was improved he was able to open eyes  bilateral without pain or difficulty and reported improvement in his vision.  I personally performed the services described in this documentation, which was scribed in my presence. The recorded information has been reviewed and is accurate.      Eyvonne MechanicJeffrey Irvine Glorioso, PA-C 06/24/14 [id number]  Rolan BuccoMelanie Belfi, MD 06/24/14 [phone]

## 2014-08-20 ENCOUNTER — Emergency Department (HOSPITAL_COMMUNITY): Payer: Self-pay

## 2014-08-20 ENCOUNTER — Encounter (HOSPITAL_COMMUNITY): Payer: Self-pay | Admitting: Emergency Medicine

## 2014-08-20 ENCOUNTER — Emergency Department (HOSPITAL_COMMUNITY)
Admission: EM | Admit: 2014-08-20 | Discharge: 2014-08-21 | Disposition: A | Discharge status: Home / Self Care | Payer: Self-pay | Attending: Emergency Medicine | Admitting: Emergency Medicine

## 2014-08-20 DIAGNOSIS — Z880 Allergy status to penicillin: Secondary | ICD-10-CM | POA: Insufficient documentation

## 2014-08-20 DIAGNOSIS — Z791 Long term (current) use of non-steroidal anti-inflammatories (NSAID): Secondary | ICD-10-CM | POA: Insufficient documentation

## 2014-08-20 DIAGNOSIS — L559 Sunburn, unspecified: Secondary | ICD-10-CM | POA: Insufficient documentation

## 2014-08-20 DIAGNOSIS — R000 Tachycardia, unspecified: Secondary | ICD-10-CM | POA: Insufficient documentation

## 2014-08-20 DIAGNOSIS — Z720 Tobacco use: Secondary | ICD-10-CM | POA: Insufficient documentation

## 2014-08-20 DIAGNOSIS — Z79899 Other long term (current) drug therapy: Secondary | ICD-10-CM | POA: Insufficient documentation

## 2014-08-20 DIAGNOSIS — R0789 Other chest pain: Secondary | ICD-10-CM | POA: Insufficient documentation

## 2014-08-20 DIAGNOSIS — E860 Dehydration: Secondary | ICD-10-CM | POA: Insufficient documentation

## 2014-08-20 DIAGNOSIS — R079 Chest pain, unspecified: Secondary | ICD-10-CM

## 2014-08-20 DIAGNOSIS — I10 Essential (primary) hypertension: Secondary | ICD-10-CM | POA: Insufficient documentation

## 2014-08-20 LAB — COMPREHENSIVE METABOLIC PANEL
ALK PHOS: 81 U/L (ref 38–126)
ALT: 41 U/L (ref 17–63)
ANION GAP: 15 (ref 5–15)
AST: 103 U/L — AB (ref 15–41)
Albumin: 3.9 g/dL (ref 3.5–5.0)
BILIRUBIN TOTAL: 1 mg/dL (ref 0.3–1.2)
BUN: 18 mg/dL (ref 6–20)
CO2: 21 mmol/L — AB (ref 22–32)
CREATININE: 1.34 mg/dL — AB (ref 0.61–1.24)
Calcium: 9.5 mg/dL (ref 8.9–10.3)
Chloride: 111 mmol/L (ref 101–111)
GFR calc Af Amer: >60 mL/min (ref >60)
Glucose, Bld: 78 mg/dL (ref 70–99)
Potassium: 3.8 mmol/L (ref 3.5–5.1)
Sodium: 147 mmol/L — ABNORMAL HIGH (ref 135–145)
Total Protein: 7.2 g/dL (ref 6.5–8.1)

## 2014-08-20 LAB — CBC WITH DIFFERENTIAL/PLATELET
Basophils Absolute: 0 10*3/uL (ref 0.0–0.1)
Basophils Relative: 0 % (ref 0–1)
Eosinophils Absolute: 0 10*3/uL (ref 0.0–0.7)
Eosinophils Relative: 0 % (ref 0–5)
HCT: 42.4 % (ref 39.0–52.0)
HEMOGLOBIN: 14.5 g/dL (ref 13.0–17.0)
LYMPHS ABS: 1.1 10*3/uL (ref 0.7–4.0)
LYMPHS PCT: 7 % — AB (ref 12–46)
MCH: 30.6 pg (ref 26.0–34.0)
MCHC: 34.2 g/dL (ref 30.0–36.0)
MCV: 89.5 fL (ref 78.0–100.0)
MONO ABS: 0.8 10*3/uL (ref 0.1–1.0)
MONOS PCT: 5 % (ref 3–12)
NEUTROS ABS: 13.4 10*3/uL — AB (ref 1.7–7.7)
Neutrophils Relative %: 87 % — ABNORMAL HIGH (ref 43–77)
Platelets: 222 10*3/uL (ref 150–400)
RBC: 4.74 MIL/uL (ref 4.22–5.81)
RDW: 12.8 % (ref 11.5–15.5)
WBC: 15.3 10*3/uL — AB (ref 4.0–10.5)

## 2014-08-20 LAB — I-STAT CREATININE, ED: Creatinine, Ser: 1 mg/dL (ref 0.61–1.24)

## 2014-08-20 LAB — CK: Total CK: 4268 U/L — ABNORMAL HIGH (ref 49–397)

## 2014-08-20 LAB — I-STAT TROPONIN, ED: TROPONIN I, POC: 0.02 ng/mL (ref 0.00–0.08)

## 2014-08-20 MED ORDER — ONDANSETRON HCL 4 MG/2ML IJ SOLN
4.0000 mg | Freq: Once | INTRAMUSCULAR | Status: AC
  Administered 2014-08-20: 4 mg via INTRAVENOUS
  Filled 2014-08-20: qty 2

## 2014-08-20 MED ORDER — SODIUM CHLORIDE 0.9 % IV BOLUS (SEPSIS)
1000.0000 mL | Freq: Once | INTRAVENOUS | Status: AC
  Administered 2014-08-20: 1000 mL via INTRAVENOUS

## 2014-08-20 MED ORDER — SODIUM CHLORIDE 0.9 % IV BOLUS (SEPSIS)
500.0000 mL | Freq: Once | INTRAVENOUS | Status: AC
  Administered 2014-08-20: 500 mL via INTRAVENOUS

## 2014-08-20 NOTE — ED Provider Notes (Signed)
CSN: [id number]     Arrival date & time 08/20/14  1743 History   First MD Initiated Contact with Patient 08/20/14 1744     No chief complaint on file.    (Consider location/radiation/quality/duration/timing/severity/associated sxs/prior Treatment) HPI   39 year old male with history of hypertension presents to the ER for evaluation of chest pain. Patient reports he was released from a detention center in [place] he proceeded to walk 43 miles to [place]. As he approaches his final destination patient developed sensation of lightheadedness, having left-sided chest pressure, felt nauseous and vomited twice. States he vomits bilious content. Was lightheadedness but was able to sit down and did not had a syncopal episode. He reports that chest pain is intermittent, currently to 1 out of 10 but can be as severe as 4 out of 10. He did seek for help and was able to contact EMS which brought him to the ER. Patient was walking because he did not have a ride home. He has been walking since 8 AM this [place] [facility] [place] and EMS picked him up on [address] in [place]. Patient denies having similar chest pain. He is a former smoker but quit for the past year. He denies having fever, neck stiffness, abdominal pain, back pain, focal numbness.  Past Medical History  Diagnosis Date  . Hypertension    Past Surgical History  Procedure Laterality Date  . Wisdom tooth extraction Bilateral 2005    x4   No family history on file. History  Substance Use Topics  . Smoking status: Current Every Day Smoker -- 1.00 packs/day for 10 years    Types: Cigarettes  . Smokeless tobacco: Never Used  . Alcohol Use: Yes     Comment: occ.    Review of Systems  All other systems reviewed and are negative.     Allergies  Amoxicillin and Penicillins  Home Medications   Prior to Admission medications   Medication Sig Start Date End Date Taking? Authorizing Provider  calcium carbonate (TUMS - DOSED IN MG  ELEMENTAL CALCIUM) 500 MG chewable tablet Chew 1 tablet by mouth daily as needed for indigestion or heartburn (indigestion).    Historical Provider, MD  ciprofloxacin (CILOXAN) 0.3 % ophthalmic solution Place 1-2 drops into the left eye 4 (four) times daily. X 5 days 10/18/13   Trixie DredgeEmily West, PA-C  EPINEPHrine (EPIPEN) 0.3 mg/0.3 mL DEVI Inject 0.3 mLs (0.3 mg total) into the muscle as needed. 05/21/11   Loren Raceravid Yelverton, MD  HYDROcodone-acetaminophen (NORCO/VICODIN) 5-325 MG per tablet Take 1-2 tablets by mouth every 4 (four) hours as needed for moderate pain or severe pain. 10/18/13   Trixie DredgeEmily West, PA-C  meloxicam (MOBIC) 7.5 MG tablet Take 2 tablets (15 mg total) by mouth daily. 02/10/14   Antony MaduraKelly Humes, PA-C  ofloxacin (OCUFLOX) 0.3 % ophthalmic solution Place 1 drop into the left eye every 2 (two) hours. 06/23/14   Eyvonne MechanicJeffrey Hedges, PA-C  omeprazole (PRILOSEC) 40 MG capsule Take 1 capsule (40 mg total) by mouth daily. 04/26/13   Rodolph BongEvan S Corey, MD  oxyCODONE-acetaminophen (PERCOCET/ROXICET) 5-325 MG per tablet Take 2 tablets by mouth every 4 (four) hours as needed for severe pain. 06/23/14   Eyvonne MechanicJeffrey Hedges, PA-C  traMADol (ULTRAM) 50 MG tablet Take 1 tablet (50 mg total) by mouth every 6 (six) hours as needed. 02/10/14   Antony MaduraKelly Humes, PA-C   There were no vitals taken for this visit. Physical Exam  Constitutional: He is oriented to person, place, and time. He appears well-developed  and well-nourished. No distress.  Fair skin Caucasian male with moderate sunburn throughout head and neck along with bilateral forearm.  HENT:  Head: Atraumatic.  Eyes: Conjunctivae are normal.  Neck: Neck supple.  Cardiovascular:  Mild tachycardia without murmurs rubs or gallops.  Pulmonary/Chest: Effort normal and breath sounds normal. No respiratory distress. He has no wheezes. He exhibits no tenderness.  Abdominal: Soft. There is no tenderness.  Musculoskeletal: He exhibits no edema.  Neurological: He is alert and oriented to  person, place, and time.  Skin: No rash noted.  Psychiatric: He has a normal mood and affect.  Nursing note and vitals reviewed.   ED Course  Procedures (including critical care time)  Patient with near syncope, chest pain, and exhaustion from walking approximately 20-30 miles. He is mentating appropriately. He is mildly tachycardic and appears dehydrated. Workup initiated, IV fluid given.  12:18 AM Patient is initially tachycardic, elevated white count of 15.3. Evidence of renal insufficiency with creatinine of 1.34, and a CK total of 4268. His symptoms markedly improved after receiving IV fluid. He is able to tolerates by mouth. Repeat creatinine level has normalized. Therefore, patient is stable for discharge. Recommend hydration. Do not think he has ACS.  Tachycardia resolved.  Labs Review Labs Reviewed  CBC WITH DIFFERENTIAL/PLATELET - Abnormal; Notable for the following:    WBC 15.3 (*)    Neutrophils Relative % 87 (*)    Neutro Abs 13.4 (*)    Lymphocytes Relative 7 (*)    All other components within normal limits  COMPREHENSIVE METABOLIC PANEL - Abnormal; Notable for the following:    Sodium 147 (*)    CO2 21 (*)    Creatinine, Ser 1.34 (*)    AST 103 (*)    All other components within normal limits  CK - Abnormal; Notable for the following:    Total CK 4268 (*)    All other components within normal limits  I-STAT TROPOININ, ED  I-STAT CREATININE, ED    Imaging Review Dg Chest 2 View  08/20/2014   CLINICAL DATA:  Upper left chest pain with nausea and emesis for 3 hr.  EXAM: CHEST  2 VIEW  COMPARISON:  01/06/2010  FINDINGS: The heart size and mediastinal contours are within normal limits. Both lungs are clear. The visualized skeletal structures are unremarkable.  IMPRESSION: No active cardiopulmonary disease.   Electronically Signed   By: Ellery Plunkaniel R Mitchell M.D.   On: 08/20/2014 19:11     EKG Interpretation None      Date: 08/21/2014  Rate: 104  Rhythm: normal sinus  rhythm  QRS Axis: normal  Intervals: normal  ST/T Wave abnormalities: normal  Conduction Disutrbances: none  Narrative Interpretation:   Old EKG Reviewed: No significant changes noted     MDM   Final diagnoses:  Chest pain  Dehydration    BP 121/68 mmHg  Pulse 74  Temp(Src) 98.1 F (36.7 C) (Oral)  Resp 23  SpO2 99%  I have reviewed nursing notes and vital signs. I personally reviewed the imaging tests through PACS system  I reviewed available ER/hospitalization records thought the EMR     Fayrene HelperBowie Lum Stillinger, PA-C 08/21/14 0020  Raeford RazorStephen Kohut, MD 08/21/14 1622

## 2014-08-20 NOTE — ED Notes (Signed)
[facility] with c/o left sided chest pain that comes and goes along with nausea, vomiting, SOB and diaphoresis. Pt has been walking since 0800 this morning from [facility] and EMS picked pt up on [address] in [place]. Pt walked approximately 20-6630miles because he did not have a ride home. CP started while he was walking then he vomited x 2.

## 2014-08-21 NOTE — Discharge Instructions (Signed)
Dehydration  The body uses the kidney, bladder, and thirst mechanisms in an intricate system to maintain the proper fluid levels in the body. When this system is stressed, such as during exercise, the system may not be able to maintain these levels. This results in the body lacking water (dehydration). Dehydration can be a problem because the body requires a certain amount of water and other fluids to maintain its blood volume. Fluid is lost when you urinate, sweat, breathe, vomit, or have diarrhea. Dehydration occurs when you drink less fluid than you lose. Dehydration may occur even before you become thirsty. It is important for athletes to keep drinking during activity, even if they do not feel thirsty. SYMPTOMS   Thirst.  Dry mouth.  Tiredness (lethargy).  Dark urine.  Headache.  Muscle cramps.  Rapid breathing.  Lightheadedness, especially when you stand from a sitting position.  Dry, warm skin.  Little or no urination.  Low blood pressure.  Fainting (syncope).  Delirium or unconsciousness. RISK INCREASES WITH:  Diarrhea.  Vomiting.  Inadequate fluid intake during an illness or strenuous exercise.  Inadequate food intake during an illness, during strenuous exercise, or after strenuous exercise.  Use of diuretic medicines, which control excess body fluid by causing fluid loss.  Certain age groups. Infants and the elderly are at greater risk. PREVENTION  Drink frequently throughout physical activity even if you do not feel thirsty. Drink small amounts of fluid frequently throughout and after sporting events.  Drink extra fluids to keep up with any ongoing losses (sweating, diarrhea).  Carry extra water and the ingredients for making an oral rehydration solution (ORS).  If you have diarrhea or vomiting or you are not drinking much, force yourself to drink more liquids before you become dehydrated. RELATED COMPLICATIONS   Reduced ability to dissipate heat,  resulting in elevated core body temperatures.  Heat illness.  Heat stroke.  Kidney failure. TREATMENT Mild dehydration is treated by drinking enough fluid to replace the fluids you have lost. You may also need to replace the electrolytes you have lost. Recommendations for replenishing fluids and electrolytes include drinking sips of water slowly, eating foods with salt, drinking sports drinks, or taking over-the-counter dehydration medicines. Treat dehydration immediately. Do not wait until dehydration becomes severe.  Packets of ORS are widely available. Follow the directions on the packet. If no instructions are given, mix the contents of the ORS with 1 quart or liter of drinking water. If you are not sure if the water is safe to drink, first boil the water for at least 5 minutes.  If you are unable to obtain ORS you may create your own by adding 2 tbs of sugar or honey,  tsp salt, and  tsp baking soda to 1 quart or liter of water. If you do not have any baking soda, add another  tsp of salt. If possible, add  cup orange juice or some mashed banana to improve the taste and provide some potassium. Drink sips of the ORS every 5 minutes until urination becomes normal. It is normal to urinate 4 or 5 times a day. Adults and adolescents should drink at least 3 quarts or liters of ORS a day until they are well. For individuals who are vomiting or have diarrhea, it is important to keep trying to drink the ORS. Your body may retain some of the fluids and salts you need even if you are vomiting or have diarrhea. Remember to take only sips of liquids. Chilling the  ORS may help. Severe dehydration is a medical emergency. If you have symptoms of severe dehydration, seek medical care immediately. It may be necessary for you to receive intravenous (IV) fluids. If you are able to drink, you should also drink the ORS. With treatment for dehydration, whatever is causing diarrhea, vomiting, or other symptoms should  also be treated.  Document Released: 03/28/2005 Document Revised: 06/20/2011 Document Reviewed: 07/10/2008 [facility] Patient Information 2015 [employer] [place]. This information is not intended to replace advice given to you by your health care provider. Make sure you discuss any questions you have with your health care provider.

## 2015-09-16 ENCOUNTER — Encounter (HOSPITAL_COMMUNITY): Payer: Self-pay | Admitting: Adult Health

## 2015-09-16 DIAGNOSIS — Z87891 Personal history of nicotine dependence: Secondary | ICD-10-CM | POA: Insufficient documentation

## 2015-09-16 DIAGNOSIS — I10 Essential (primary) hypertension: Secondary | ICD-10-CM | POA: Insufficient documentation

## 2015-09-16 DIAGNOSIS — Z880 Allergy status to penicillin: Secondary | ICD-10-CM | POA: Insufficient documentation

## 2015-09-16 DIAGNOSIS — K5900 Constipation, unspecified: Secondary | ICD-10-CM | POA: Insufficient documentation

## 2015-09-16 LAB — CBC
HEMATOCRIT: 46.2 % (ref 39.0–52.0)
Hemoglobin: 15.7 g/dL (ref 13.0–17.0)
MCH: 30.8 pg (ref 26.0–34.0)
MCHC: 34 g/dL (ref 30.0–36.0)
MCV: 90.8 fL (ref 78.0–100.0)
PLATELETS: 264 10*3/uL (ref 150–400)
RBC: 5.09 MIL/uL (ref 4.22–5.81)
RDW: 13.4 % (ref 11.5–15.5)
WBC: 9.2 10*3/uL (ref 4.0–10.5)

## 2015-09-16 LAB — COMPREHENSIVE METABOLIC PANEL
ALT: 30 U/L (ref 17–63)
AST: 32 U/L (ref 15–41)
Albumin: 4.1 g/dL (ref 3.5–5.0)
Alkaline Phosphatase: 91 U/L (ref 38–126)
Anion gap: 8 (ref 5–15)
BUN: 11 mg/dL (ref 6–20)
CHLORIDE: 104 mmol/L (ref 101–111)
CO2: 26 mmol/L (ref 22–32)
CREATININE: 0.99 mg/dL (ref 0.61–1.24)
Calcium: 9.6 mg/dL (ref 8.9–10.3)
GFR calc Af Amer: >60 mL/min (ref >60)
GFR calc non Af Amer: >60 mL/min (ref >60)
Glucose, Bld: 127 mg/dL — ABNORMAL HIGH (ref 65–99)
Potassium: 4.1 mmol/L (ref 3.5–5.1)
SODIUM: 138 mmol/L (ref 135–145)
Total Bilirubin: 1.2 mg/dL (ref 0.3–1.2)
Total Protein: 7.1 g/dL (ref 6.5–8.1)

## 2015-09-16 LAB — LIPASE, BLOOD: LIPASE: 30 U/L (ref 11–51)

## 2015-09-16 NOTE — ED Notes (Addendum)
Presents with abdominal pain/epigastric pain for the past 3 weeks it has gotten worse, endorses indigestion, heartburn, inability to eat anything but french fries and bake potatoes. Inability to have a bowel movement for 3 days. Tums and prilosec are not helping. Denies vomitnig. Denies chest pain and SOB. Denies blood in stool or dark stools. Abdomen is soft.  Pain is described as burning.

## 2015-09-17 ENCOUNTER — Emergency Department (HOSPITAL_COMMUNITY)
Admission: EM | Admit: 2015-09-17 | Discharge: 2015-09-17 | Disposition: A | Discharge status: Home / Self Care | Payer: Self-pay | Attending: Emergency Medicine | Admitting: Emergency Medicine

## 2015-09-17 ENCOUNTER — Emergency Department (HOSPITAL_COMMUNITY): Payer: Self-pay

## 2015-09-17 DIAGNOSIS — R1013 Epigastric pain: Secondary | ICD-10-CM

## 2015-09-17 DIAGNOSIS — K5900 Constipation, unspecified: Secondary | ICD-10-CM

## 2015-09-17 HISTORY — DX: Gastro-esophageal reflux disease without esophagitis: K21.9

## 2015-09-17 MED ORDER — SUCRALFATE 1 G PO TABS: 1.0000 g | ORAL_TABLET | Freq: Three times a day (TID) | ORAL | Status: DC

## 2015-09-17 MED ORDER — GI COCKTAIL ~~LOC~~
30.0000 mL | Freq: Once | ORAL | Status: AC
  Administered 2015-09-17: 30 mL via ORAL
  Filled 2015-09-17: qty 30

## 2015-09-17 MED ORDER — OMEPRAZOLE 20 MG PO CPDR: 20.0000 mg | DELAYED_RELEASE_CAPSULE | Freq: Two times a day (BID) | ORAL | Status: AC

## 2015-09-17 MED ORDER — POLYETHYLENE GLYCOL 3350 17 G PO PACK: PACK | ORAL | Status: AC

## 2015-09-17 NOTE — ED Provider Notes (Signed)
CSN: [id number]     Arrival date & time 09/16/15  2041 History   First MD Initiated Contact with Patient 09/17/15 0013     Chief Complaint  Patient presents with  . Abdominal Pain     (Consider location/radiation/quality/duration/timing/severity/associated sxs/prior Treatment) HPI Comments: Patient presents with 2-3 weeks of pain in epigastric abdomen that has become constant and is worsening in intensity. He takes Prilosec daily and has been taking increasing doses of TUMs with decreasing efficacy. He states he has difficulty eating anything but baked potatoes secondary to discomfort.  No fever, nausea or vomiting. He reports worsening constipation over the last one week. He has used magnesium citrate with limited results. No blood in his bowel movements.   Patient is a 40 y.o. male presenting with abdominal pain. The history is provided by the patient. No language interpreter was used.  Abdominal Pain Pain location:  Epigastric Pain quality: sharp   Pain radiates to:  Does not radiate Pain severity:  Moderate Onset quality:  Gradual Duration:  3 weeks Associated symptoms: no chills, no fever, no nausea and no vomiting     Past Medical History  Diagnosis Date  . Hypertension   . GERD (gastroesophageal reflux disease)    Past Surgical History  Procedure Laterality Date  . Wisdom tooth extraction Bilateral 2005    x4   History reviewed. No pertinent family history. Social History  Substance Use Topics  . Smoking status: Former Smoker -- 1.00 packs/day for 10 years    Types: Cigarettes    Quit date: 08/19/2013  . Smokeless tobacco: Never Used  . Alcohol Use: Yes     Comment: occ.    Review of Systems  Constitutional: Negative for fever and chills.  Respiratory: Negative.   Cardiovascular: Negative.   Gastrointestinal: Positive for abdominal pain. Negative for nausea and vomiting.  Musculoskeletal: Negative.  Negative for back pain.  Skin: Negative.   Neurological:  Negative.       Allergies  Amoxicillin and Penicillins  Home Medications   Prior to Admission medications   Medication Sig Start Date End Date Taking? Authorizing Provider  ciprofloxacin (CILOXAN) 0.3 % ophthalmic solution Place 1-2 drops into the left eye 4 (four) times daily. X 5 days Patient not taking: Reported on 08/20/2014 10/18/13   Trixie DredgeEmily West, PA-C  EPINEPHrine (EPIPEN) 0.3 mg/0.3 mL DEVI Inject 0.3 mLs (0.3 mg total) into the muscle as needed. Patient not taking: Reported on 08/20/2014 05/21/11   Loren Raceravid Yelverton, MD  HYDROcodone-acetaminophen (NORCO/VICODIN) 5-325 MG per tablet Take 1-2 tablets by mouth every 4 (four) hours as needed for moderate pain or severe pain. Patient not taking: Reported on 08/20/2014 10/18/13   Trixie DredgeEmily West, PA-C  meloxicam (MOBIC) 7.5 MG tablet Take 2 tablets (15 mg total) by mouth daily. Patient not taking: Reported on 08/20/2014 02/10/14   Antony MaduraKelly Humes, PA-C  ofloxacin (OCUFLOX) 0.3 % ophthalmic solution Place 1 drop into the left eye every 2 (two) hours. Patient not taking: Reported on 08/20/2014 06/23/14   Eyvonne MechanicJeffrey Hedges, PA-C  omeprazole (PRILOSEC) 40 MG capsule Take 1 capsule (40 mg total) by mouth daily. Patient not taking: Reported on 08/20/2014 04/26/13   Rodolph BongEvan S Corey, MD  oxyCODONE-acetaminophen (PERCOCET/ROXICET) 5-325 MG per tablet Take 2 tablets by mouth every 4 (four) hours as needed for severe pain. Patient not taking: Reported on 08/20/2014 06/23/14   Eyvonne MechanicJeffrey Hedges, PA-C  traMADol (ULTRAM) 50 MG tablet Take 1 tablet (50 mg total) by mouth every 6 (six) hours as  needed. Patient not taking: Reported on 08/20/2014 02/10/14   Antony Madura, PA-C   BP 140/77 mmHg  Pulse 68  Temp(Src) 98.2 F (36.8 C) (Oral)  Resp 18  Ht  (1.753 m)  Wt 118.984 kg  BMI 38.72 kg/m2  SpO2 97% Physical Exam  Constitutional: He is oriented to person, place, and time. He appears well-developed and well-nourished.  HENT:  Head: Normocephalic.  Neck: Normal range of  motion. Neck supple.  Cardiovascular: Normal rate and regular rhythm.   Pulmonary/Chest: Effort normal and breath sounds normal. He has no wheezes. He has no rales.  Abdominal: Soft. Bowel sounds are normal. There is tenderness (Epigastric tenderness.). There is no rebound and no guarding.  Musculoskeletal: Normal range of motion.  Neurological: He is alert and oriented to person, place, and time.  Skin: Skin is warm and dry. No rash noted.  Psychiatric: He has a normal mood and affect.    ED Course  Procedures (including critical care time) Labs Review Labs Reviewed  COMPREHENSIVE METABOLIC PANEL - Abnormal; Notable for the following:    Glucose, Bld 127 (*)    All other components within normal limits  LIPASE, BLOOD  CBC    Imaging Review Dg Abd Acute W/chest  09/17/2015  CLINICAL DATA:  Initial evaluation for 3 week history of upper abdominal pain. EXAM: DG ABDOMEN ACUTE W/ 1V CHEST COMPARISON:  Prior radiograph from 08/20/2014. FINDINGS: Cardiac and mediastinal silhouettes are stable in size and contour, and remain within normal limits. Lungs are mildly hypoinflated. No focal infiltrate, pulmonary edema, or pleural effusion. No pneumothorax. Paucity of gas somewhat limits evaluation of the bowels. No evidence for obstruction or ileus. No abnormal bowel wall thickening. No free air. No soft tissue mass or abnormal calcification. Moderate amount of retained stool within the right colon. No acute osseous abnormality. IMPRESSION: 1. Nonobstructive bowel gas pattern with no radiographic evidence for acute intra-abdominal process. 2. No active cardiopulmonary disease. Electronically Signed   By: Rise Mu M.D.   On: 09/17/2015 01:55   I have personally reviewed and evaluated these images and lab results as part of my medical decision-making.   EKG Interpretation None      MDM   Final diagnoses:  None    1. Dyspepsia  Presents with epigastric pain usually controlled  with daily prilosec, worsening over 2-3 weeks. Pain is resolved with GI Cocktail. Labs are reassuring. No tenderness specific to RUQ, normal LFT's - doubt cholecystitis. VSS. He is comfortable and nontender on re-examination. He is felt stable for discharge home.     Elpidio Anis, PA-C 09/17/15 0224  Layla Maw Ward, DO 09/17/15 1610

## 2015-09-17 NOTE — Discharge Instructions (Signed)
Constipation, Adult Constipation is when a person has fewer than three bowel movements a week, has difficulty having a bowel movement, or has stools that are dry, hard, or larger than normal. As people grow older, constipation is more common. A low-fiber diet, not taking in enough fluids, and taking certain medicines may make constipation worse.  CAUSES   Certain medicines, such as antidepressants, pain medicine, iron supplements, antacids, and water pills.   Certain diseases, such as diabetes, irritable bowel syndrome (IBS), thyroid disease, or depression.   Not drinking enough water.   Not eating enough fiber-rich foods.   Stress or travel.   Lack of physical activity or exercise.   Ignoring the urge to have a bowel movement.   Using laxatives too much.  SIGNS AND SYMPTOMS   Having fewer than three bowel movements a week.   Straining to have a bowel movement.   Having stools that are hard, dry, or larger than normal.   Feeling full or bloated.   Pain in the lower abdomen.   Not feeling relief after having a bowel movement.  DIAGNOSIS  Your health care provider will take a medical history and perform a physical exam. Further testing may be done for severe constipation. Some tests may include:  A barium enema X-ray to examine your rectum, colon, and, sometimes, your small intestine.   A sigmoidoscopy to examine your lower colon.   A colonoscopy to examine your entire colon. TREATMENT  Treatment will depend on the severity of your constipation and what is causing it. Some dietary treatments include drinking more fluids and eating more fiber-rich foods. Lifestyle treatments may include regular exercise. If these diet and lifestyle recommendations do not help, your health care provider may recommend taking over-the-counter laxative medicines to help you have bowel movements. Prescription medicines may be prescribed if over-the-counter medicines do not work.    HOME CARE INSTRUCTIONS   Eat foods that have a lot of fiber, such as fruits, vegetables, whole grains, and beans.  Limit foods high in fat and processed sugars, such as french fries, hamburgers, cookies, candies, and soda.   A fiber supplement may be added to your diet if you cannot get enough fiber from foods.   Drink enough fluids to keep your urine clear or pale yellow.   Exercise regularly or as directed by your health care provider.   Go to the restroom when you have the urge to go. Do not hold it.   Only take over-the-counter or prescription medicines as directed by your health care provider. Do not take other medicines for constipation without talking to your health care provider first.  SEEK IMMEDIATE MEDICAL CARE IF:   You have bright red blood in your stool.   Your constipation lasts for more than 4 days or gets worse.   You have abdominal or rectal pain.   You have thin, pencil-like stools.   You have unexplained weight loss. MAKE SURE YOU:   Understand these instructions.  Will watch your condition.  Will get help right away if you are not doing well or get worse.   This information is not intended to replace advice given to you by your health care provider. Make sure you discuss any questions you have with your health care provider.   Document Released: 12/25/2003 Document Revised: 04/18/2014 Document Reviewed: 01/07/2013 Elsevier Interactive Patient Education 2016 Else[employer] Inc[employer] Indigestion Indigestion is a feeling of pain, discomfort, burning, or fullness in the upper part of your abdomen. It can  come and go. It may occur frequently or rarely. Indigestion tends to occur while you are eating or right after you have finished eating. It may be worse at night and while bending over or lying down. HOME CARE INSTRUCTIONS Take these actions to decrease your pain or discomfort and to help avoid complications. Diet  Follow a diet as recommended by your  health care provider. This may involve avoiding foods and drinks such as:  Coffee and tea (with or without caffeine).  Drinks that contain alcohol.  Energy drinks and sports drinks.  Carbonated drinks or sodas.  Chocolate and cocoa.  Peppermint and mint flavorings.  Garlic and onions.  Horseradish.  Spicy and acidic foods, including peppers, chili powder, curry powder, vinegar, hot sauces, and barbecue sauce.  Citrus fruit juices and citrus fruits, such as oranges, lemons, and limes.  Tomato-based foods, such as red sauce, chili, salsa, and pizza with red sauce.  Fried and fatty foods, such as donuts, french fries, potato chips, and high-fat dressings.  High-fat meats, such as hot dogs and fatty cuts of red and white meats, such as rib eye steak, sausage, ham, and bacon.  High-fat dairy items, such as whole milk, butter, and cream cheese.  Eat small, frequent meals instead of large meals.  Avoid drinking large amounts of liquid with your meals.  Avoid eating meals during the 2-3 hours before bedtime.  Avoid lying down right after you eat.  Do not exercise right after you eat. General Instructions  Pay attention to any changes in your symptoms.  Take over-the-counter and prescription medicines only as told by your health care provider. Do not take aspirin, ibuprofen, or other NSAIDs unless your health care provider told you to do so.  Do not use any tobacco products, including cigarettes, chewing tobacco, and e-cigarettes. If you need help quitting, ask your health care provider.  Wear loose-fitting clothing. Do not wear anything tight around your waist that causes pressure on your abdomen.  Raise (elevate) the head of your bed about 6 inches (15 cm).  Try to reduce your stress, such as with yoga or meditation. If you need help reducing stress, ask your health care provider.  If you are overweight, reduce your weight to an amount that is healthy for you. Ask your  health care provider for guidance about a safe weight loss goal.  Keep all follow-up visits as told by your health care provider. This is important. SEEK MEDICAL CARE IF:  You have new symptoms.  You have unexplained weight loss.  You have difficulty swallowing, or it hurts to swallow.  Your symptoms do not improve with treatment.  Your symptoms last for more than two days.  You have a fever.  You vomit. SEEK IMMEDIATE MEDICAL CARE IF:  You have pain in your arms, neck, jaw, teeth, or back.  You feel sweaty, dizzy, or light-headed.  You faint.  You have chest pain or shortness of breath.  You cannot stop vomiting, or you vomit blood.  Your stool is bloody or black.  You have severe pain in your abdomen.   This information is not intended to replace advice given to you by your health care provider. Make sure you discuss any questions you have with your health care provider.   Document Released: 05/05/2004 Document Revised: 12/17/2014 Document Reviewed: 07/23/2014 Elsevier Interactive Patient Education [employer]2016 Else[employer] Inc[employer]

## 2015-09-24 ENCOUNTER — Encounter (HOSPITAL_COMMUNITY): Payer: Self-pay

## 2015-09-24 ENCOUNTER — Emergency Department (HOSPITAL_COMMUNITY): Payer: Self-pay

## 2015-09-24 ENCOUNTER — Emergency Department (HOSPITAL_COMMUNITY)
Admission: EM | Admit: 2015-09-24 | Discharge: 2015-09-25 | Disposition: A | Discharge status: Home / Self Care | Payer: Self-pay | Attending: Emergency Medicine | Admitting: Emergency Medicine

## 2015-09-24 DIAGNOSIS — Z87891 Personal history of nicotine dependence: Secondary | ICD-10-CM | POA: Insufficient documentation

## 2015-09-24 DIAGNOSIS — X58XXXA Exposure to other specified factors, initial encounter: Secondary | ICD-10-CM | POA: Insufficient documentation

## 2015-09-24 DIAGNOSIS — Y9289 Other specified places as the place of occurrence of the external cause: Secondary | ICD-10-CM | POA: Insufficient documentation

## 2015-09-24 DIAGNOSIS — Y9389 Activity, other specified: Secondary | ICD-10-CM | POA: Insufficient documentation

## 2015-09-24 DIAGNOSIS — I10 Essential (primary) hypertension: Secondary | ICD-10-CM | POA: Insufficient documentation

## 2015-09-24 DIAGNOSIS — S76012A Strain of muscle, fascia and tendon of left hip, initial encounter: Secondary | ICD-10-CM

## 2015-09-24 DIAGNOSIS — Y990 Civilian activity done for income or pay: Secondary | ICD-10-CM | POA: Insufficient documentation

## 2015-09-24 DIAGNOSIS — M76892 Other specified enthesopathies of left lower limb, excluding foot: Secondary | ICD-10-CM

## 2015-09-24 DIAGNOSIS — M25552 Pain in left hip: Secondary | ICD-10-CM

## 2015-09-24 MED ORDER — NAPROXEN 500 MG PO TABS: 500.0000 mg | ORAL_TABLET | Freq: Two times a day (BID) | ORAL | Status: DC | PRN

## 2015-09-24 MED ORDER — HYDROCODONE-ACETAMINOPHEN 5-325 MG PO TABS: 1.0000 | ORAL_TABLET | Freq: Four times a day (QID) | ORAL | Status: DC | PRN

## 2015-09-24 MED ORDER — KETOROLAC TROMETHAMINE 30 MG/ML IJ SOLN
30.0000 mg | Freq: Once | INTRAMUSCULAR | Status: AC
  Administered 2015-09-24: 30 mg via INTRAMUSCULAR
  Filled 2015-09-24 (×2): qty 1

## 2015-09-24 NOTE — Discharge Instructions (Signed)
Take naprosyn as directed for inflammation and pain with norco for breakthrough pain. Do not drive or operate machinery with pain medication use. Use crutches for comfort to help with walking. Use ice and heat to areas of soreness, no more than 20 minutes at a time every hour for each. Follow up with the orthopedist for recheck of ongoing symptoms in the next 1-2 weeks. Return to ER for emergent changing or worsening of symptoms.     Hip Pain Your hip is the joint between your upper legs and your lower pelvis. The bones, cartilage, tendons, and muscles of your hip joint perform a lot of work each day supporting your body weight and allowing you to move around. Hip pain can range from a minor ache to severe pain in one or both of your hips. Pain may be felt on the inside of the hip joint near the groin, or the outside near the buttocks and upper thigh. You may have swelling or stiffness as well.  HOME CARE INSTRUCTIONS   Take medicines only as directed by your health care provider.  Apply ice to the injured area:  Put ice in a plastic bag.  Place a towel between your skin and the bag.  Leave the ice on for 15-20 minutes at a time, 3-4 times a day.  Keep your leg raised (elevated) when possible to lessen swelling.  Avoid activities that cause pain.  Follow specific exercises as directed by your health care provider.  Sleep with a pillow between your legs on your most comfortable side.  Record how often you have hip pain, the location of the pain, and what it feels like. SEEK MEDICAL CARE IF:   You are unable to put weight on your leg.  Your hip is red or swollen or very tender to touch.  Your pain or swelling continues or worsens after 1 week.  You have increasing difficulty walking.  You have a fever. SEEK IMMEDIATE MEDICAL CARE IF:   You have fallen.  You have a sudden increase in pain and swelling in your hip. MAKE SURE YOU:   Understand these instructions.  Will watch  your condition.  Will get help right away if you are not doing well or get worse.   This information is not intended to replace advice given to you by your health care provider. Make sure you discuss any questions you have with your health care provider.   Document Released: 09/15/2009 Document Revised: 04/18/2014 Document Reviewed: 11/22/2012 Elsevier Interactive Patient Education 2016 [employer] Inc[employer]  Muscle Strain A muscle strain is an injury that occurs when a muscle is stretched beyond its normal length. Usually a small number of muscle fibers are torn when this happens. Muscle strain is rated in degrees. First-degree strains have the least amount of muscle fiber tearing and pain. Second-degree and third-degree strains have increasingly more tearing and pain.  Usually, recovery from muscle strain takes 1-2 weeks. Complete healing takes 5-6 weeks.  CAUSES  Muscle strain happens when a sudden, violent force placed on a muscle stretches it too far. This may occur with lifting, sports, or a fall.  RISK FACTORS Muscle strain is especially common in athletes.  SIGNS AND SYMPTOMS At the site of the muscle strain, there may be:  Pain.  Bruising.  Swelling.  Difficulty using the muscle due to pain or lack of normal function. DIAGNOSIS  Your health care provider will perform a physical exam and ask about your medical history. TREATMENT  Often,  the best treatment for a muscle strain is resting, icing, and applying cold compresses to the injured area.  HOME CARE INSTRUCTIONS   Use the PRICE method of treatment to promote muscle healing during the first 2-3 days after your injury. The PRICE method involves:  Protecting the muscle from being injured again.  Restricting your activity and resting the injured body part.  Icing your injury. To do this, put ice in a plastic bag. Place a towel between your skin and the bag. Then, apply the ice and leave it on from 15-20 minutes each hour.  After the third day, switch to moist heat packs.  Apply compression to the injured area with a splint or elastic bandage. Be careful not to wrap it too tightly. This may interfere with blood circulation or increase swelling.  Elevate the injured body part above the level of your heart as often as you can.  Only take over-the-counter or prescription medicines for pain, discomfort, or fever as directed by your health care provider.  Warming up prior to exercise helps to prevent future muscle strains. SEEK MEDICAL CARE IF:   You have increasing pain or swelling in the injured area.  You have numbness, tingling, or a significant loss of strength in the injured area. MAKE SURE YOU:   Understand these instructions.  Will watch your condition.  Will get help right away if you are not doing well or get worse.   This information is not intended to replace advice given to you by your health care provider. Make sure you discuss any questions you have with your health care provider.   Document Released: 03/28/2005 Document Revised: 01/16/2013 Document Reviewed: 10/25/2012 Elsevier Interactive Patient Education 2016 [employer] Inc.  Tendinitis and Tenosynovitis  Tendinitis is inflammation of the tendon. Tenosynovitis is inflammation of the lining around the tendon (tendon sheath). These painful conditions often occur at once. Tendons attach muscle to bone. To move a limb, force from the muscle moves through the tendon, to the bone. These conditions often cause increased pain when moving. Tendinitis may be caused by a small or partial tear in the tendon.  SYMPTOMS   Pain, tenderness, redness, bruising, or swelling at the injury.  Loss of normal joint movement.  Pain that gets worse with use of the muscle and joint attached to the tendon.  Weakness in the tendon, caused by calcium build up that may occur with tendinitis.  Commonly affected tendons:  Achilles tendon (calf of leg).  Rotator  cuff (shoulder joint).  Patellar tendon (kneecap to shin).  Peroneal tendon (ankle).  Posterior tibial tendon (inner ankle).  Biceps tendon (in front of shoulder). CAUSES   Sudden strain on a flexed muscle, muscle overuse, sudden increase or change in activity, vigorous activity.  Result of a direct hit (less common).  Poor muscle action (biomechanics). RISK INCREASES WITH:  Injury (trauma).  Too much exercise.  Sudden change in athletic activity.  Incorrect exercise form or technique.  Poor strength and flexibility.  Not warming-up properly before activity.  Returning to activity before healing is complete. PREVENTION   Warm-up and stretch properly before activity.  Maintain physical fitness:  Joint flexibility.  Muscle strength and endurance.  Fitness that increases heart rate.  Learn and use proper exercise techniques.  Use rehabilitation exercises to strengthen weak muscles and tendons.  Ice the tendon after activity, to reduce recurring inflammation.  Wear proper fitting protective equipment for specific tendons, when indicated. PROGNOSIS  When treated properly, can be cured  in 6 to 8 weeks. Recovery may take longer, depending on degree of injury.  RELATED COMPLICATIONS   Re-injury or recurring symptoms.  Permanent weakness or joint stiffness, if injury is severe and recovery is not completed.  Delayed healing, if sports are started before healing is complete.  Tearing apart (rupture) of the inflamed tendon. Tendinitis means the tendon is injured and must recover. TREATMENT  Treatment first involves ice, medicine, and rest from aggravating activities. This reduces pain and inflammation. Modifying your activity may be considered to prevent recurring injury. A brace, elastic bandage wrap, splint, cast, or sling may be prescribed to protect the joint for a short period. After that period, strengthening and stretching exercise may help to regain strength  and full range of motion. If the condition persists, despite non-surgical treatment, surgery may be recommended to remove the inflamed tendon lining. Corticosteroid injections may be given to reduce inflammation. However, these injections may weaken the tendon and increase your risk for tendon rupture. MEDICATION   If pain medicine is needed, nonsteroidal anti-inflammatory medicines (aspirin and ibuprofen), or other minor pain relievers (acetaminophen), are often recommended.  Do not take pain medicine for 7 days before surgery.  Prescription pain relievers are usually prescribed only after surgery. Use only as directed and only as much as you need.  Ointments applied to the skin may be helpful.  Corticosteroid injections may be given to reduce inflammation. However, this may increase your risk of a tendon rupture. HEAT AND COLD  Cold treatment (icing) relieves pain and reduces inflammation. Cold treatment should be applied for 10 to 15 minutes every 2 to 3 hours, and immediately after activity that aggravates your symptoms. Use ice packs or an ice massage.  Heat treatment may be used before performing stretching and strengthening activities prescribed by your caregiver, physical therapist, or athletic trainer. Use a heat pack or a warm water soak. SEEK MEDICAL CARE IF:   Symptoms get worse or do not improve, despite treatment.  Pain becomes too much to tolerate.  You develop numbness or tingling.  Toes become cold, or toenails become blue, gray, or dark colored.  New, unexplained symptoms develop. (Drugs used in treatment may produce side effects.)   This information is not intended to replace advice given to you by your health care provider. Make sure you discuss any questions you have with your health care provider.   Document Released: 03/28/2005 Document Revised: 06/20/2011 Document Reviewed: 07/10/2008 Elsevier Interactive Patient Education 2016 [employer] Inc[employer]  [employer]  Therapy Heat therapy can help ease sore, stiff, injured, and tight muscles and joints. Heat relaxes your muscles, which may help ease your pain. Heat therapy should only be used on old, pre-existing, or long-lasting (chronic) injuries. Do not use heat therapy unless told by your doctor. HOW TO USE HEAT THERAPY There are several different kinds of heat therapy, including:  Moist heat pack.  Warm water bath.  Hot water bottle.  Electric heating pad.  Heated gel pack.  Heated wrap.  Electric heating pad. GENERAL HEAT THERAPY RECOMMENDATIONS   Do not sleep while using heat therapy. Only use heat therapy while you are awake.  Your skin may turn pink while using heat therapy. Do not use heat therapy if your skin turns red.  Do not use heat therapy if you have new pain.  High heat or long exposure to heat can cause burns. Be careful when using heat therapy to avoid burning your skin.  Do not use heat therapy on areas  of your skin that are already irritated, such as with a rash or sunburn. GET HELP IF:   You have blisters, redness, swelling (puffiness), or numbness.  You have new pain.  Your pain is worse. MAKE SURE YOU:  Understand these instructions.  Will watch your condition.  Will get help right away if you are not doing well or get worse.   This information is not intended to replace advice given to you by your health care provider. Make sure you discuss any questions you have with your health care provider.   Document Released: 06/20/2011 Document Revised: 04/18/2014 Document Reviewed: 05/21/2013 Elsevier Interactive Patient Education 2016 [employer]  Cryotherapy Cryotherapy is when you put ice on your injury. Ice helps lessen pain and puffiness (swelling) after an injury. Ice works the best when you start using it in the first 24 to 48 hours after an injury. HOME CARE  Put a dry or damp towel between the ice pack and your skin.  You may press gently on the  ice pack.  Leave the ice on for no more than 10 to 20 minutes at a time.  Check your skin after 5 minutes to make sure your skin is okay.  Rest at least 20 minutes between ice pack uses.  Stop using ice when your skin loses feeling (numbness).  Do not use ice on someone who cannot tell you when it hurts. This includes small children and people with memory problems (dementia). GET HELP RIGHT AWAY IF:  You have white spots on your skin.  Your skin turns blue or pale.  Your skin feels waxy or hard.  Your puffiness gets worse. MAKE SURE YOU:   Understand these instructions.  Will watch your condition.  Will get help right away if you are not doing well or get worse.   This information is not intended to replace advice given to you by your health care provider. Make sure you discuss any questions you have with your health care provider.   Document Released: 09/14/2007 Document Revised: 06/20/2011 Document Reviewed: 11/18/2010 Elsevier Interactive Patient Education [employer]

## 2015-09-24 NOTE — ED Provider Notes (Signed)
CSN: [id number]     Arrival date & time 09/24/15  2141 History  By signing my name below, I, Hunter Price, attest that this documentation has been prepared under the direction and in the presence of [address], [employer]. Electronically Signed: Phillis Price, ED Scribe. 09/24/2015. 10:39 PM.   Chief Complaint  Patient presents with  . Hip Pain   Patient is a 40 y.o. male presenting with hip pain. The history is provided by the patient. No language interpreter was used.  Hip Pain This is a new problem. The current episode started yesterday. The problem occurs constantly. The problem has been gradually worsening. Pertinent negatives include no chest pain, no abdominal pain and no shortness of breath. The symptoms are aggravated by walking. Nothing relieves the symptoms. He has tried a warm compress (and ibuprofen) for the symptoms. The treatment provided no relief.    HPI Comments: [name] is a 40 y.o. Male with a PMHx of HTN who presents to the Emergency Department complaining of gradual onset, gradually worsening left hip pain onset one day ago. Pt reports pain is 8/10, intermittent, sharp, nonradiating pain, worse with movement and walking, tried heating pad and ibuprofen with no relief. 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 He denies trauma to the area, falls, joint swelling, redness, warmth, bruising, fevers, chills, CP, SOB, abd pain, N/V/D/C, hematuria, dysuria, myalgias, back pain, numbness, tingling, weakness, or skin changes.  Past Medical History  Diagnosis Date  . Hypertension   . GERD (gastroesophageal reflux disease)    Past Surgical History  Procedure Laterality Date  . Wisdom tooth extraction Bilateral 2005    x4   History reviewed. No pertinent family history. Social History  Substance Use Topics  . Smoking status: Former Smoker -- 1.00 packs/day for 10 years    Types: Cigarettes    Quit date: 08/19/2013   . Smokeless tobacco: Never Used  . Alcohol Use: Yes     Comment: occ.    Review of Systems  Constitutional: Negative for fever and chills.  Respiratory: Negative for shortness of breath.   Cardiovascular: Negative for chest pain.  Gastrointestinal: Negative for nausea, vomiting, abdominal pain, diarrhea and constipation.  Genitourinary: Negative for dysuria and hematuria.  Musculoskeletal: Positive for arthralgias. Negative for myalgias, back pain and joint swelling.  Skin: Negative for color change.  Allergic/Immunologic: Negative for immunocompromised state.  Neurological: Negative for weakness and numbness.  Psychiatric/Behavioral: Negative for confusion.  10 Systems reviewed and all are negative for acute change except as noted in the HPI.  Allergies  Amoxicillin and Penicillins  Home Medications   Prior to Admission medications   Medication Sig Start Date End Date Taking? Authorizing Provider  ciprofloxacin (CILOXAN) 0.3 % ophthalmic solution Place 1-2 drops into the left eye 4 (four) times daily. X 5 days Patient not taking: Reported on 08/20/2014 10/18/13   Trixie Dredge, PA-C  EPINEPHrine (EPIPEN) 0.3 mg/0.3 mL DEVI Inject 0.3 mLs (0.3 mg total) into the muscle as needed. 05/21/11   Loren Racer, MD  HYDROcodone-acetaminophen (NORCO/VICODIN) 5-325 MG per tablet Take 1-2 tablets by mouth every 4 (four) hours as needed for moderate pain or severe pain. Patient not taking: Reported on 08/20/2014 10/18/13   Trixie Dredge, PA-C  meloxicam (MOBIC) 7.5 MG tablet Take 2 tablets (15 mg total) by mouth daily. Patient not taking: Reported on 08/20/2014 02/10/14   Antony Madura, PA-C  ofloxacin (OCUFLOX) 0.3 % ophthalmic  solution Place 1 drop into the left eye every 2 (two) hours. Patient not taking: Reported on 08/20/2014 06/23/14   Eyvonne Mechanic, PA-C  omeprazole (PRILOSEC) 20 MG capsule Take 1 capsule (20 mg total) by mouth 2 (two) times daily before a meal. 09/17/15   Elpidio Anis, PA-C   oxyCODONE-acetaminophen (PERCOCET/ROXICET) 5-325 MG per tablet Take 2 tablets by mouth every 4 (four) hours as needed for severe pain. Patient not taking: Reported on 08/20/2014 06/23/14   Eyvonne Mechanic, PA-C  polyethylene glycol [facility]) packet Use three times daily until bowels move - maximum 3 consecutive days 09/17/15   Elpidio Anis, PA-C  sucralfate (CARAFATE) 1 g tablet Take 1 tablet (1 g total) by mouth 4 (four) times daily -  with meals and at bedtime. 09/17/15   Elpidio Anis, PA-C  traMADol (ULTRAM) 50 MG tablet Take 1 tablet (50 mg total) by mouth every 6 (six) hours as needed. Patient not taking: Reported on 08/20/2014 02/10/14   Antony Madura, PA-C   BP 133/80 mmHg  Pulse 99  Temp(Src) 98.3 F (36.8 C) (Oral)  Resp 16  SpO2 96% Physical Exam  Constitutional: He is oriented to person, place, and time. Vital signs are normal. He appears well-developed and well-nourished.  Non-toxic appearance. No distress.  Afebrile, nontoxic, NAD  HENT:  Head: Normocephalic and atraumatic.  Mouth/Throat: Mucous membranes are normal.  Eyes: Conjunctivae and EOM are normal. Right eye exhibits no discharge. Left eye exhibits no discharge.  Neck: Normal range of motion. Neck supple.  Cardiovascular: Normal rate and intact distal pulses.   Pulmonary/Chest: Effort normal. No respiratory distress.  Abdominal: Normal appearance. He exhibits no distension.  Musculoskeletal: Normal range of motion.       Left hip: He exhibits tenderness and bony tenderness. He exhibits normal range of motion, normal strength, no swelling, no crepitus and no deformity.       Legs: L hip with FROM intact, with minimal anterior TTP near the rectus femoris origin at the hip joint line, no bruising or swelling, no crepitus or deformity, no limb length discrepancy or abnormal rotation. No pain with log roll testing. Strength and sensation grossly intact, distal pulses intact, compartments soft.   Neurological: He is alert and  oriented to person, place, and time. He has normal strength. No sensory deficit.  Skin: Skin is warm, dry and intact. No rash noted.  Psychiatric: He has a normal mood and affect.  Nursing note and vitals reviewed.   ED Course  Procedures (including critical care time) DIAGNOSTIC STUDIES: Oxygen Saturation is 96% on RA, normal by my interpretation.    COORDINATION OF CARE: 10:38 PM-Discussed treatment plan which includes x-ray and toradol with pt at bedside and pt agreed to plan.    Labs Review Labs Reviewed - No data to display  Imaging Review Dg Hip Unilat With Pelvis 2-3 Views Left  09/24/2015  CLINICAL DATA:  Increasing left hip pain for a month. No known injury. Unable to bear weight today. EXAM: DG HIP (WITH OR WITHOUT PELVIS) 2-3V LEFT COMPARISON:  None. FINDINGS: There is no evidence of hip fracture or dislocation. There is no evidence of arthropathy or other focal bone abnormality. IMPRESSION: Negative. Electronically Signed   By: Burman Nieves M.D.   On: 09/24/2015 23:45   I have personally reviewed and evaluated these images and lab results as part of my medical decision-making.   EKG Interpretation None      MDM   Final diagnoses:  Left hip pain  Hip strain, left, initial encounter  Hip flexor tendinitis, left    40 y.o. male here with atraumatic L hip pain, tenderness mostly in the anterior hip near the rectus femoris origin site, no other joint line tenderness, neg log roll testing, NVI with soft compartments, no warmth/swelling/crepitus, no limb length discrepancy. Will obtain xray to ensure no acute injury vs arthritis, but likely just tendinitis type issue at the quads. Will give toradol IM and reassess after xray.   11:50 PM Xray neg. Likely muscular injury/tendinitis type pain. Will start on naprosyn and give norco for additional relief, but proper use of this med discussed. Ice/heat use discussed. F/up with ortho in 1-2wks for ongoing symptom recheck.  Crutches given for comfort. I explained the diagnosis and have given explicit precautions to return to the ER including for any other new or worsening symptoms. The patient understands and accepts the medical plan as it's been dictated and I have answered their questions. Discharge instructions concerning home care and prescriptions have been given. The patient is STABLE and is discharged to home in good condition.   I personally performed the services described in this documentation, which was scribed in my presence. The recorded information has been reviewed and is accurate.  BP 133/80 mmHg  Pulse 99  Temp(Src) 98.3 F (36.8 C) (Oral)  Resp 16  SpO2 96%  Meds ordered this encounter  Medications  . ketorolac (TORADOL) 30 MG/ML injection 30 mg    Sig:   . HYDROcodone-acetaminophen (NORCO) 5-325 MG tablet    Sig: Take 1 tablet by mouth every 6 (six) hours as needed for severe pain.    Dispense:  6 tablet    Refill:  0    Order Specific Question:  Supervising Provider    Answer:  MILLER, BRIAN [[id number]]  . naproxen (NAPROSYN) 500 MG tablet    Sig: Take 1 tablet (500 mg total) by mouth 2 (two) times daily as needed for mild pain or moderate pain (TAKE WITH MEALS.).    Dispense:  20 tablet    Refill:  0    Order Specific Question:  Supervising Provider    Answer:  Eber HongMILLER, BRIAN [[id number]]       Lajoyce Tamura Camprubi-Soms, PA-C 09/24/15 2351  Jacalyn LefevreJulie Haviland, MD 09/28/15 [phone]

## 2015-09-24 NOTE — ED Notes (Signed)
Pt c/o of 10/10 left hip pain w/o traumatic injury or fall. Pt states pain began yesterday and has become progressive.

## 2015-09-25 NOTE — ED Notes (Signed)
Patient d/c'd self care.  Patient given education on the use of crutches.  This RN expressed the importance of patient obtaining a PCP.  F/U and medications reviewed.  Patient verbalized understanding.

## 2016-09-22 ENCOUNTER — Encounter (HOSPITAL_BASED_OUTPATIENT_CLINIC_OR_DEPARTMENT_OTHER): Payer: Self-pay | Admitting: *Deleted

## 2016-09-22 ENCOUNTER — Emergency Department (HOSPITAL_BASED_OUTPATIENT_CLINIC_OR_DEPARTMENT_OTHER)
Admission: EM | Admit: 2016-09-22 | Discharge: 2016-09-22 | Disposition: A | Discharge status: Home / Self Care | Payer: Self-pay | Attending: Physician Assistant | Admitting: Physician Assistant

## 2016-09-22 DIAGNOSIS — Z87891 Personal history of nicotine dependence: Secondary | ICD-10-CM | POA: Insufficient documentation

## 2016-09-22 DIAGNOSIS — M25512 Pain in left shoulder: Secondary | ICD-10-CM | POA: Insufficient documentation

## 2016-09-22 DIAGNOSIS — M545 Low back pain, unspecified: Secondary | ICD-10-CM

## 2016-09-22 DIAGNOSIS — I10 Essential (primary) hypertension: Secondary | ICD-10-CM | POA: Insufficient documentation

## 2016-09-22 MED ORDER — NAPROXEN 500 MG PO TABS: 500.0000 mg | ORAL_TABLET | Freq: Two times a day (BID) | ORAL | 0 refills | Status: DC | PRN

## 2016-09-22 MED ORDER — CYCLOBENZAPRINE HCL 5 MG PO TABS: 5.0000 mg | ORAL_TABLET | Freq: Three times a day (TID) | ORAL | 0 refills | Status: DC | PRN

## 2016-09-22 NOTE — ED Provider Notes (Signed)
[facility]   CSN: [id number] Arrival date & time: 09/22/16  1255     History   Chief Complaint Chief Complaint  Patient presents with  . Back Pain    HPI Hunter Price is a 41 y.o. male.  HPI   41 year old male with PMH of GERD and HTN (no longer on medications) who presents with lower left back pain and left shoulder pain after fall today. 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 Reports he was walking through a puddle and he slipped with his going forward out from under him and fell backwards. Denies hitting head or LOC. This fall occurred at approximately 10 am. As the day has continued his pain has worsened. He is able to walk, bend at the waist, and move his upper extremities. Endorses chronic tingling in his hands bilaterally that is unchanged since the fall. No weakness of an extremity.   Past Medical History:  Diagnosis Date  . GERD (gastroesophageal reflux disease)   . Hypertension     There are no active problems to display for this patient.   Past Surgical History:  Procedure Laterality Date  . WISDOM TOOTH EXTRACTION Bilateral 2005   x4       Home Medications    Prior to Admission medications   Medication Sig Start Date End Date Taking? Authorizing Provider  cyclobenzaprine (FLEXERIL) 5 MG tablet Take 1 tablet (5 mg total) by mouth 3 (three) times daily as needed for muscle spasms. 09/22/16   Arvilla Market, DO  EPINEPHrine (EPIPEN) 0.3 mg/0.3 mL DEVI Inject 0.3 mLs (0.3 mg total) into the muscle as needed. 05/21/11   Loren Racer, MD  naproxen (NAPROSYN) 500 MG tablet Take 1 tablet (500 mg total) by mouth 2 (two) times daily as needed. 09/22/16 09/22/17  Arvilla Market, DO  omeprazole (PRILOSEC) 20 MG capsule Take 1 capsule (20 mg total) by mouth 2 (two) times daily before a meal. 09/17/15   Upstill, Melvenia Beam, PA-C  polyethylene glycol [facility]) packet Use three times daily until bowels move -  maximum 3 consecutive days 09/17/15   Elpidio Anis, PA-C  sucralfate (CARAFATE) 1 g tablet Take 1 tablet (1 g total) by mouth 4 (four) times daily -  with meals and at bedtime. 09/17/15   Elpidio Anis, PA-C    Family History No family history on file.  Social History Social History  Substance Use Topics  . Smoking status: Former Smoker    Packs/day: 0.00    Years: 0.00    Types: E-cigarettes    Quit date: 08/19/2013  . Smokeless tobacco: Never Used     Comment: vapes  . Alcohol use Yes     Comment: occ.     Allergies   Amoxicillin and Penicillins   Review of Systems Review of Systems  Constitutional: Negative for chills and fever.  Respiratory: Negative for shortness of breath.   Cardiovascular: Negative for chest pain.  Musculoskeletal: Positive for arthralgias, back pain and myalgias.  Neurological: Negative for dizziness, weakness and headaches.  Psychiatric/Behavioral: Negative for confusion.     Physical Exam Updated Vital Signs BP (!) 155/94   Pulse 82   Temp 98.1 F (36.7 C) (Oral)   Resp 16   Ht 5\' 7"  (1.702 m)   Wt 117.9 kg (260 lb)   SpO2 95%   BMI 40.72 kg/m   Physical Exam  Constitutional: He is oriented to person, place, and time.  He appears well-developed and well-nourished. No distress.  HENT:  Head: Normocephalic and atraumatic.  Mouth/Throat: Oropharynx is clear and moist.  Eyes: EOM are normal. Pupils are equal, round, and reactive to light.  Neck: Normal range of motion. Neck supple.  Cardiovascular: Normal rate, regular rhythm and normal heart sounds.   No murmur heard. Pulmonary/Chest: Effort normal and breath sounds normal. No respiratory distress.  Musculoskeletal:  TTP over left lumbar paraspinal region. No TTP over lumbar spine. Mild TTP over left shoulder and left upper arm without joint deformity. Moves all extremities normally. Intact flexion and extension of the back.   Neurological: He is alert and oriented to person, place,  and time. No sensory deficit.  Normal gait. Strength 5/5 in upper extremity bilaterally.   Skin: Skin is warm and dry.  No bruising.   Psychiatric: He has a normal mood and affect.     ED Treatments / Results  Labs (all labs ordered are listed, but only abnormal results are displayed) Labs Reviewed - No data to display  EKG  EKG Interpretation None       Radiology No results found.  Procedures Procedures (including critical care time)  Medications Ordered in ED Medications - No data to display   Initial Impression / Assessment and Plan / ED Course  I have reviewed the triage vital signs and the nursing notes.  Pertinent labs & imaging results that were available during my care of the patient were reviewed by me and considered in my medical decision making (see chart for details).    41 year old male presenting with back pain and left upper extremity pain s/p fall. Patient is has full ROM and strength of his left upper extremity. No TTP over spinous processes. Patient declines imaging and low suspicion for fracture. Suspect musculoskeletal pain. Will discharge with Naproxen and Flexeril. Recommended applying ice to the area and gentle ROM exercises for the left shoulder. Return precautions discussed.   Of note, BP is slightly elevated. Likely due to pain. Patient has history of HTN previously treated with medications but reports these were discontinued when BP became controlled with weight loss. Have recommended outpatient follow up for BP to ensure it normalizes with resolution of his pain.   Final Clinical Impressions(s) / ED Diagnoses   Final diagnoses:  Acute left-sided low back pain without sciatica  Acute pain of left shoulder    New Prescriptions New Prescriptions   CYCLOBENZAPRINE (FLEXERIL) 5 MG TABLET    Take 1 tablet (5 mg total) by mouth 3 (three) times daily as needed for muscle spasms.   NAPROXEN (NAPROSYN) 500 MG TABLET    Take 1 tablet (500 mg total)  by mouth 2 (two) times daily as needed.     Arvilla MarketWallace, Elyanna Wallick Lauren, DO 09/22/16 1429    Abelino DerrickMackuen, Courteney Lyn, MD 09/23/16 [phone]

## 2016-09-22 NOTE — ED Notes (Signed)
Pt took ibuprofen at approx 1030 this am.

## 2016-09-22 NOTE — ED Triage Notes (Signed)
States he slipped and fell landing on his back. Pain in his lower back and left shoulder. 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

## 2016-11-07 ENCOUNTER — Emergency Department
Admission: EM | Admit: 2016-11-07 | Discharge: 2016-11-07 | Disposition: A | Discharge status: Home / Self Care | Payer: Self-pay | Attending: Emergency Medicine | Admitting: Emergency Medicine

## 2016-11-07 ENCOUNTER — Encounter: Payer: Self-pay | Admitting: Emergency Medicine

## 2016-11-07 DIAGNOSIS — Z87891 Personal history of nicotine dependence: Secondary | ICD-10-CM | POA: Insufficient documentation

## 2016-11-07 DIAGNOSIS — I10 Essential (primary) hypertension: Secondary | ICD-10-CM | POA: Insufficient documentation

## 2016-11-07 DIAGNOSIS — B0052 Herpesviral keratitis: Secondary | ICD-10-CM | POA: Insufficient documentation

## 2016-11-07 DIAGNOSIS — Z79899 Other long term (current) drug therapy: Secondary | ICD-10-CM | POA: Insufficient documentation

## 2016-11-07 MED ORDER — KETOROLAC TROMETHAMINE 0.5 % OP SOLN: 1.0000 [drp] | Freq: Four times a day (QID) | OPHTHALMIC | 0 refills | Status: DC

## 2016-11-07 MED ORDER — CIPROFLOXACIN HCL 0.3 % OP SOLN: 1.0000 [drp] | OPHTHALMIC | 0 refills | Status: AC

## 2016-11-07 MED ORDER — FLUORESCEIN SODIUM 0.6 MG OP STRP
1.0000 | ORAL_STRIP | Freq: Once | OPHTHALMIC | Status: DC
  Filled 2016-11-07: qty 1

## 2016-11-07 MED ORDER — CIPROFLOXACIN HCL 0.3 % OP SOLN
2.0000 [drp] | Freq: Once | OPHTHALMIC | Status: AC
  Administered 2016-11-07: 2 [drp] via OPHTHALMIC
  Filled 2016-11-07: qty 2.5

## 2016-11-07 MED ORDER — TETRACAINE HCL 0.5 % OP SOLN
2.0000 [drp] | Freq: Once | OPHTHALMIC | Status: AC
  Administered 2016-11-07: 2 [drp] via OPHTHALMIC
  Filled 2016-11-07: qty 4

## 2016-11-07 NOTE — ED Notes (Signed)
See triage note  Presents with pain to left eye for couple of days   Denies any injury

## 2016-11-07 NOTE — ED Provider Notes (Signed)
[facility] Emergency Department Provider Note  ____________________________________________  Time seen: Approximately 6:01 PM  I have reviewed the triage vital signs and the nursing notes.   HISTORY  Chief Complaint Eye Pain    HPI Hunter Price is a 41 y.o. male who presents emergency department complaining of left eye pain. Patient reports that he woke up this morning with a sharp sensation to the left eye. He endorses some mild light sensitivity. 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 He reports that this feels similar to the previous corneal ulcer. Patient wears contacts and states that he wears a monthly contact and the most recent lenses have been in place for 1 week. Patient denies any pressure or drainage. Reports that his eye has been tearing. No other complaints at this time.   Past Medical History:  Diagnosis Date  . GERD (gastroesophageal reflux disease)   . Hypertension     There are no active problems to display for this patient.   Past Surgical History:  Procedure Laterality Date  . WISDOM TOOTH EXTRACTION Bilateral 2005   x4    Prior to Admission medications   Medication Sig Start Date End Date Taking? Authorizing Provider  ciprofloxacin (CILOXAN) 0.3 % ophthalmic solution Place 1 drop into the left eye every 2 (two) hours. Administer 1 drop, every 2 hours, while awake, for 2 days. Then 1 drop, every 4 hours, while awake, for the next 5 days. 11/07/16 11/12/16  Burgundy Matuszak, Delorise RoyalsJonathan D, PA-C  cyclobenzaprine (FLEXERIL) 5 MG tablet Take 1 tablet (5 mg total) by mouth 3 (three) times daily as needed for muscle spasms. 09/22/16   Arvilla MarketWallace, Catherine Lauren, DO  EPINEPHrine (EPIPEN) 0.3 mg/0.3 mL DEVI Inject 0.3 mLs (0.3 mg total) into the muscle as needed. 05/21/11   Loren RacerYelverton, David, MD  ketorolac (ACULAR) 0.5 % ophthalmic solution Place 1 drop into the left eye 4 (four) times daily. 11/07/16   Bode Pieper,  Delorise RoyalsJonathan D, PA-C  naproxen (NAPROSYN) 500 MG tablet Take 1 tablet (500 mg total) by mouth 2 (two) times daily as needed. 09/22/16 09/22/17  Arvilla MarketWallace, Catherine Lauren, DO  omeprazole (PRILOSEC) 20 MG capsule Take 1 capsule (20 mg total) by mouth 2 (two) times daily before a meal. 09/17/15   Upstill, Melvenia BeamShari, PA-C  polyethylene glycol [facility]IRALAX) packet Use three times daily until bowels move - maximum 3 consecutive days 09/17/15   Elpidio AnisUpstill, Shari, PA-C  sucralfate (CARAFATE) 1 g tablet Take 1 tablet (1 g total) by mouth 4 (four) times daily -  with meals and at bedtime. 09/17/15   Elpidio AnisUpstill, Shari, PA-C    Allergies Amoxicillin and Penicillins  No family history on file.  Social History Social History  Substance Use Topics  . Smoking status: Former Smoker    Packs/day: 0.00    Years: 0.00    Types: E-cigarettes    Quit date: 08/19/2013  . Smokeless tobacco: Never Used     Comment: vapes  . Alcohol use Yes     Comment: occ.     Review of Systems  Constitutional: No fever/chills Eyes: No visual changes. No discharge. Positive for left eye pain. Positive for mild light sensitivity. ENT: No upper respiratory complaints. Cardiovascular: no chest pain. Respiratory: no cough. No SOB. Gastrointestinal: No abdominal pain.  No nausea, no vomiting.   Musculoskeletal: Negative for musculoskeletal pain. Skin: Negative for rash, abrasions, lacerations, ecchymosis. Neurological: Negative for headaches, focal weakness or numbness. 10-point  ROS otherwise negative.  ____________________________________________   PHYSICAL EXAM:  VITAL SIGNS: ED Triage Vitals  Enc Vitals Group     BP 11/07/16 1718 (!) 158/99     Pulse Rate 11/07/16 1718 63     Resp 11/07/16 1718 18     Temp 11/07/16 1718 98.6 F (37 C)     Temp Source 11/07/16 1718 Oral     SpO2 11/07/16 1718 99 %     Weight 11/07/16 1719 260 lb (117.9 kg)     Height 11/07/16 1719 5\' 9"  (1.753 m)     Head Circumference --      Peak Flow --       Pain Score 11/07/16 1717 7     Pain Loc --      Pain Edu? --      Excl. in GC? --      Constitutional: Alert and oriented. Well appearing and in no acute distress. Eyes: Conjunctivae are normal. PERRL. EOMI.Funduscopic exam reveals no foreign body or acute abnormality to the cornea. Red reflex, vasculature optic disc unremarkable. Left eye anesthetized using tetracaine. 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 Head: Atraumatic. ENT:      Ears:       Nose: No congestion/rhinnorhea.      Mouth/Throat: Mucous membranes are moist.  Neck: No stridor.    Cardiovascular: Normal rate, regular rhythm. Normal S1 and S2.  Good peripheral circulation. Respiratory: Normal respiratory effort without tachypnea or retractions. Lungs CTAB. Good air entry to the bases with no decreased or absent breath sounds. Musculoskeletal: Full range of motion to all extremities. No gross deformities appreciated. Neurologic:  Normal speech and language. No gross focal neurologic deficits are appreciated.  Skin:  Skin is warm, dry and intact. No rash noted. Psychiatric: Mood and affect are normal. Speech and behavior are normal. Patient exhibits appropriate insight and judgement.   ____________________________________________   LABS (all labs ordered are listed, but only abnormal results are displayed)  Labs Reviewed - No data to display ____________________________________________  EKG   ____________________________________________  RADIOLOGY   No results found.  ____________________________________________    PROCEDURES  Procedure(s) performed:    Procedures    Medications  tetracaine (PONTOCAINE) 0.5 % ophthalmic solution 2 drop (not administered)  fluorescein ophthalmic strip 1 strip (not administered)  ciprofloxacin (CILOXAN) 0.3 % ophthalmic solution 2 drop (not administered)     ____________________________________________   INITIAL  IMPRESSION / ASSESSMENT AND PLAN / ED COURSE  Pertinent labs & imaging results that were available during my care of the patient were reviewed by me and considered in my medical decision making (see chart for details).  Review of the [place] CSRS was performed in accordance of the [facility] prior to dispensing any controlled drugs.     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 Patient is a contact wearer and fluorescein staining reveals area of uptake consistent with dendritic ulcer to the left eye. Patient is given Cipro eyedrops.. Patient will be discharged home with prescriptions for Cipro eyedrops as well as Acular. Patient is to follow up with ophthalmology as needed or otherwise directed. Patient is given ED precautions to return to the ED for any worsening or new symptoms.     ____________________________________________  FINAL CLINICAL IMPRESSION(S) / ED DIAGNOSES  Final diagnoses:  Dendritic ulcer      NEW MEDICATIONS STARTED DURING THIS VISIT:  New Prescriptions   CIPROFLOXACIN (CILOXAN) 0.3 % OPHTHALMIC  SOLUTION    Place 1 drop into the left eye every 2 (two) hours. Administer 1 drop, every 2 hours, while awake, for 2 days. Then 1 drop, every 4 hours, while awake, for the next 5 days.   KETOROLAC (ACULAR) 0.5 % OPHTHALMIC SOLUTION    Place 1 drop into the left eye 4 (four) times daily.        This chart was dictated using voice recognition software/Dragon. Despite best efforts to proofread, errors can occur which can change the meaning. Any change was purely unintentional.    Racheal Patches, PA-C 11/07/16 Marylin Crosby, MD 11/07/16 2005

## 2016-11-07 NOTE — ED Triage Notes (Signed)
L keye and periorbital pain today. No known injuty.

## 2017-08-13 ENCOUNTER — Emergency Department
Admission: EM | Admit: 2017-08-13 | Discharge: 2017-08-13 | Disposition: A | Discharge status: Home / Self Care | Payer: Self-pay | Attending: Emergency Medicine | Admitting: Emergency Medicine

## 2017-08-13 ENCOUNTER — Encounter: Payer: Self-pay | Admitting: Emergency Medicine

## 2017-08-13 ENCOUNTER — Other Ambulatory Visit: Payer: Self-pay

## 2017-08-13 DIAGNOSIS — Z87891 Personal history of nicotine dependence: Secondary | ICD-10-CM | POA: Insufficient documentation

## 2017-08-13 DIAGNOSIS — L03313 Cellulitis of chest wall: Secondary | ICD-10-CM | POA: Insufficient documentation

## 2017-08-13 DIAGNOSIS — I10 Essential (primary) hypertension: Secondary | ICD-10-CM | POA: Insufficient documentation

## 2017-08-13 DIAGNOSIS — Z79899 Other long term (current) drug therapy: Secondary | ICD-10-CM | POA: Insufficient documentation

## 2017-08-13 MED ORDER — SULFAMETHOXAZOLE-TRIMETHOPRIM 800-160 MG PO TABS: 1.0000 | ORAL_TABLET | Freq: Two times a day (BID) | ORAL | 0 refills | Status: AC

## 2017-08-13 MED ORDER — AZITHROMYCIN 250 MG PO TABS: ORAL_TABLET | ORAL | 0 refills | Status: AC

## 2017-08-13 NOTE — ED Notes (Signed)
See triage note  Presents with a possible insect bite to right lateral rib area couple of days ago  Area is red and swollen  No drainage noted

## 2017-08-13 NOTE — Discharge Instructions (Addendum)
Begin applying warm moist compresses to the area frequently.  Begin taking antibiotics as directed Z-Pak and Bactrim until finished.  Tylenol or ibuprofen as needed for pain.  If not improving follow-up with [facility] acute care or urgent care of your choice.

## 2017-08-13 NOTE — ED Triage Notes (Signed)
Here for possible insect bite to right axillary area.  Been present X 1 week.  Redness noted.  No fever.

## 2017-08-13 NOTE — ED Provider Notes (Signed)
[facility] Emergency Department Provider Note  ____________________________________________   First MD Initiated Contact with Patient 08/13/17 1154     (approximate)  I have reviewed the triage vital signs and the nursing notes.   HISTORY  Chief Complaint Insect Bite   HPI Hunter Price is a 42 y.o. male is here with complaint of probable insect bite to his right upper trunk area.  Patient states that happened approximately 1 week ago and instead of getting better it seems to be getting worse.  He denies any fever or chills.  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  He rates his pain as 4 out of 10.   Past Medical History:  Diagnosis Date  . GERD (gastroesophageal reflux disease)   . Hypertension     There are no active problems to display for this patient.   Past Surgical History:  Procedure Laterality Date  . WISDOM TOOTH EXTRACTION Bilateral 2005   x4    Prior to Admission medications   Medication Sig Start Date End Date Taking? Authorizing Provider  azithromycin (ZITHROMAX Z-PAK) 250 MG tablet Take 2 tablets (500 mg) on  Day 1,  followed by 1 tablet (250 mg) once daily on Days 2 through 5. 08/13/17   Tommi Rumps, PA-C  EPINEPHrine (EPIPEN) 0.3 mg/0.3 mL DEVI Inject 0.3 mLs (0.3 mg total) into the muscle as needed. 05/21/11   Loren Racer, MD  omeprazole (PRILOSEC) 20 MG capsule Take 1 capsule (20 mg total) by mouth 2 (two) times daily before a meal. 09/17/15   Upstill, Melvenia Beam, PA-C  polyethylene glycol [facility]) packet Use three times daily until bowels move - maximum 3 consecutive days 09/17/15   Elpidio Anis, PA-C  sulfamethoxazole-trimethoprim (BACTRIM DS,SEPTRA DS) 800-160 MG tablet Take 1 tablet by mouth 2 (two) times daily. 08/13/17   Tommi Rumps, PA-C  clonazePAM (KLONOPIN) 1 MG tablet Take 1 tablet (1 mg total) by mouth 2 (two) times daily as needed for  anxiety. 08/22/11 04/26/13  Elvina Sidle, MD    Allergies Amoxicillin and Penicillins  History reviewed. No pertinent family history.  Social History Social History   Tobacco Use  . Smoking status: Former Smoker    Packs/day: 0.00    Years: 0.00    Pack years: 0.00    Types: E-cigarettes    Last attempt to quit: 08/19/2013    Years since quitting: 3.9  . Smokeless tobacco: Never Used  . Tobacco comment: vapes  Substance Use Topics  . Alcohol use: Yes    Comment: occ.  . Drug use: No    Review of Systems Constitutional: No fever/chills Eyes: No visual changes. Cardiovascular: Denies chest pain. Respiratory: Denies shortness of breath. Musculoskeletal: Negative for muscle aches. Skin: Positive for erythema. Neurological: Negative for headaches, focal weakness or numbness. ____________________________________________   PHYSICAL EXAM:  VITAL SIGNS: ED Triage Vitals  Enc Vitals Group     BP 08/13/17 1134 (!) 155/91     Pulse Rate 08/13/17 1134 (!) 106     Resp 08/13/17 1134 18     Temp 08/13/17 1134 98.2 F (36.8 C)     Temp Source 08/13/17 1134 Oral     SpO2 08/13/17 1134 94 %     Weight 08/13/17 1132 265 lb (120.2 kg)     Height 08/13/17 1132  (1.753 m)     Head Circumference --  Peak Flow --      Pain Score 08/13/17 1132 4     Pain Loc --      Pain Edu? --      Excl. in GC? --    Constitutional: Alert and oriented. Well appearing and in no acute distress. Eyes: Conjunctivae are normal.  Head: Atraumatic. Neck: No stridor.   Cardiovascular: Normal rate, regular rhythm. Grossly normal heart sounds.  Good peripheral circulation. Respiratory: Normal respiratory effort.  No retractions. Lungs CTAB. Gastrointestinal: Soft and nontender. No distention. No abdominal bruits. No CVA tenderness. Musculoskeletal: No lower extremity tenderness nor edema.  No joint effusions. Neurologic:  Normal speech and language. No gross focal neurologic deficits are  appreciated.  Skin:  Skin is warm, dry and intact.  Right lateral trunk area has large 6 cm erythematous area that is warm to touch.  Center is tender and does appear to have been a bite.  No abscess formation is noted. Psychiatric: Mood and affect are normal. Speech and behavior are normal.  ____________________________________________   LABS (all labs ordered are listed, but only abnormal results are displayed)  Labs Reviewed - No data to display   PROCEDURES  Procedure(s) performed: None  Procedures  Critical Care performed: No  ____________________________________________   INITIAL IMPRESSION / ASSESSMENT AND PLAN / ED COURSE  As part of my medical decision making, I reviewed the following data within the electronic MEDICAL RECORD NUMBER Notes from prior ED visits and [place] Controlled Substance Database  Patient presents with erythematous area most likely from an insect bite that has been present for 1 week.  Patient is afebrile.  He was placed on Zithromax due to his penicillin allergies and Bactrim.  Patient was instructed to use warm moist compresses to the area frequently and take Tylenol or ibuprofen as needed for pain.  He is to follow-up with [facility] acute care if any continued problems.  ____________________________________________   FINAL CLINICAL IMPRESSION(S) / ED DIAGNOSES  Final diagnoses:  Cellulitis of chest wall     ED Discharge Orders        Ordered    azithromycin (ZITHROMAX Z-PAK) 250 MG tablet     08/13/17 1222    sulfamethoxazole-trimethoprim (BACTRIM DS,SEPTRA DS) 800-160 MG tablet  2 times daily     08/13/17 1222       Note:  This document was prepared using Dragon voice recognition software and may include unintentional dictation errors.    Tommi Rumps, PA-C 08/13/17 1224    Jene Every, MD 08/13/17 1314

## 2017-08-18 ENCOUNTER — Encounter (HOSPITAL_BASED_OUTPATIENT_CLINIC_OR_DEPARTMENT_OTHER): Payer: Self-pay | Admitting: Emergency Medicine

## 2017-08-18 ENCOUNTER — Emergency Department (HOSPITAL_BASED_OUTPATIENT_CLINIC_OR_DEPARTMENT_OTHER)
Admission: EM | Admit: 2017-08-18 | Discharge: 2017-08-18 | Disposition: A | Discharge status: Home / Self Care | Payer: Self-pay | Attending: Physician Assistant | Admitting: Physician Assistant

## 2017-08-18 ENCOUNTER — Other Ambulatory Visit: Payer: Self-pay

## 2017-08-18 DIAGNOSIS — Z79899 Other long term (current) drug therapy: Secondary | ICD-10-CM | POA: Insufficient documentation

## 2017-08-18 DIAGNOSIS — L03313 Cellulitis of chest wall: Secondary | ICD-10-CM | POA: Insufficient documentation

## 2017-08-18 DIAGNOSIS — I10 Essential (primary) hypertension: Secondary | ICD-10-CM | POA: Insufficient documentation

## 2017-08-18 DIAGNOSIS — Z87891 Personal history of nicotine dependence: Secondary | ICD-10-CM | POA: Insufficient documentation

## 2017-08-18 MED ORDER — LIDOCAINE HCL (PF) 1 % IJ SOLN
5.0000 mL | Freq: Once | INTRAMUSCULAR | Status: AC
  Administered 2017-08-18: 5 mL via INTRADERMAL
  Filled 2017-08-18: qty 5

## 2017-08-18 MED ORDER — DOXYCYCLINE HYCLATE 100 MG PO CAPS: 100.0000 mg | ORAL_CAPSULE | Freq: Two times a day (BID) | ORAL | 0 refills | Status: AC

## 2017-08-18 MED ORDER — DOXYCYCLINE HYCLATE 100 MG PO TABS
100.0000 mg | ORAL_TABLET | Freq: Once | ORAL | Status: AC
  Administered 2017-08-18: 100 mg via ORAL
  Filled 2017-08-18: qty 1

## 2017-08-18 MED FILL — DOXYCYCLINE HYCLATE 100 MG: 100 | 10 days supply | Qty: 20 | Fill #0

## 2017-08-18 NOTE — ED Notes (Signed)
ED Provider at bedside. 

## 2017-08-18 NOTE — ED Provider Notes (Signed)
[facility] EMERGENCY DEPARTMENT Provider Note   CSN: [id number] Arrival date & time: 08/18/17  1033     History   Chief Complaint Chief Complaint  Patient presents with  . Wound Check    HPI Hunter Price is a 42 y.o. male.  HPI   Is a 42 year old male presenting with an area of redness to his right chest wall.  Patient was seen 1 week ago for similar.  Patient reports that approximately 9 days ago he was traveling down to [place] and noticed an area of erythema to the right chest wall.  Was seen at emergency department and treated with Bactrim and Z-Pak.  He reports that he only used warm compresses once.  He did take most of the antibiotics.  However he reports is been getting worse.  He reports the last 2 days he is also felt fatigued, felt both hot and cold.  Denies any travel outside the [place].  Denies any outdoor activities or exposure to ticks.  Past Medical History:  Diagnosis Date  . GERD (gastroesophageal reflux disease)   . Hypertension     There are no active problems to display for this patient.   Past Surgical History:  Procedure Laterality Date  . WISDOM TOOTH EXTRACTION Bilateral 2005   x4        Home Medications    Prior to Admission medications   Medication Sig Start Date End Date Taking? Authorizing Provider  azithromycin (ZITHROMAX Z-PAK) 250 MG tablet Take 2 tablets (500 mg) on  Day 1,  followed by 1 tablet (250 mg) once daily on Days 2 through 5. 08/13/17   Tommi Rumps, PA-C  EPINEPHrine (EPIPEN) 0.3 mg/0.3 mL DEVI Inject 0.3 mLs (0.3 mg total) into the muscle as needed. 05/21/11   Loren Racer, MD  omeprazole (PRILOSEC) 20 MG capsule Take 1 capsule (20 mg total) by mouth 2 (two) times daily before a meal. 09/17/15   Upstill, Melvenia Beam, PA-C  polyethylene glycol [facility]) packet Use three times daily until bowels move - maximum 3 consecutive days 09/17/15   Elpidio Anis, PA-C  sulfamethoxazole-trimethoprim (BACTRIM DS,SEPTRA DS)  800-160 MG tablet Take 1 tablet by mouth 2 (two) times daily. 08/13/17   Tommi Rumps, PA-C    Family History History reviewed. No pertinent family history.  Social History Social History   Tobacco Use  . Smoking status: Former Smoker    Packs/day: 0.00    Years: 0.00    Pack years: 0.00    Types: E-cigarettes    Last attempt to quit: 08/19/2013    Years since quitting: 4.0  . Smokeless tobacco: Never Used  . Tobacco comment: vapes  Substance Use Topics  . Alcohol use: Yes    Comment: occ.  . Drug use: No     Allergies   Amoxicillin and Penicillins   Review of Systems Review of Systems  Constitutional: Positive for fatigue. Negative for activity change.  Respiratory: Negative for shortness of breath.   Cardiovascular: Negative for chest pain.  Gastrointestinal: Negative for abdominal pain.  Skin: Positive for rash.     Physical Exam Updated Vital Signs BP (!) 167/104   Pulse 95   Temp 98 F (36.7 C) (Oral)   Resp 16   Ht  (1.753 m)   Wt 120.2 kg (265 lb)   SpO2 97%   BMI 39.13 kg/m   Physical Exam  Constitutional: He is oriented to person, place, and time. He appears well-nourished.  HENT:  Head: Normocephalic.  Eyes: Conjunctivae are normal.  Cardiovascular: Normal rate.  Pulmonary/Chest: Effort normal.  Neurological: He is oriented to person, place, and time.  Skin: Skin is warm and dry. He is not diaphoretic.  See attached photo, well-circumscribed erythematous area with crusted top.  Psychiatric: He has a normal mood and affect. His behavior is normal.       ED Treatments / Results  Labs (all labs ordered are listed, but only abnormal results are displayed) Labs Reviewed - No data to display  EKG None  Radiology No results found.  Procedures .Marland KitchenIncision and Drainage Date/Time: 08/18/2017 2:22 PM Performed by: Abelino Derrick, MD Authorized by: Abelino Derrick, MD   Consent:    Consent obtained:  Verbal    Consent given by:  Patient Location:    Type:  Abscess   Location:  Trunk   Trunk location:  Chest Anesthesia (see MAR for exact dosages):    Anesthesia method:  Local infiltration   Local anesthetic:  Lidocaine 1% WITH epi Procedure type:    Complexity:  Complex Procedure details:    Needle aspiration: no     Incision types:  Single straight   Scalpel blade:  11   Wound management:  Probed and deloculated   Drainage:  Bloody   Drainage amount:  Scant   Packing materials:  None Post-procedure details:    Patient tolerance of procedure:  Tolerated well, no immediate complications   (including critical care time)  EMERGENCY DEPARTMENT US SOFT TISSUE INTERPRETATION "Study: Limited Soft Tissue Ultrasound"  INDICATIONS: Soft tissue infection Multiple views of the body part were obtained in real-time with a multi-frequency linear probe PERFORMED BY:  Myself IMAGES ARCHIVED?: No SIDE:Right  BODY PART:Chest wall FINDINGS: No abcess noted and Cellulitis present INTERPRETATION:  No abcess noted and Cellulitis present      Medications Ordered in ED Medications  lidocaine (PF) (XYLOCAINE) 1 % injection 5 mL (5 mLs Intradermal Given by Other 08/18/17 1123)  doxycycline (VIBRA-TABS) tablet 100 mg (100 mg Oral Given 08/18/17 1123)     Initial Impression / Assessment and Plan / ED Course  I have reviewed the triage vital signs and the nursing notes.  Pertinent labs & imaging results that were available during my care of the patient were reviewed by me and considered in my medical decision making (see chart for details).     Is a 42 year old male presenting with an area of redness to his right chest wall.  Patient was seen 1 week ago for similar.  Patient reports that approximately 9 days ago he was traveling down to [place] and noticed an area of erythema to the right chest wall.  Was seen at emergency department and treated with Bactrim and Z-Pak.  He reports that he only used  warm compresses once.  He did take most of the antibiotics.  However he reports is been getting worse.  He reports the last 2 days he is also felt fatigued, felt both hot and cold.  Denies any travel outside the [place].  Denies any outdoor activities or exposure to ticks.  11:34 AM No area of fluctuance, however there is obvious cellulitis.  Cellulitis is ideally well-circumscribed..  Will unroof areas so that the patient is able to use warm compresses to express any residual abscess.  Otherwise patient has cellulitis visualized by ultrasound.  Will change patient's antibiotics, to doxycycline.  We will have him follow-up with primary care physician.  Final Clinical Impressions(s) / ED Diagnoses  Final diagnoses:  None    ED Discharge Orders    None       Abelino Derrick, MD 08/18/17 1423

## 2017-08-18 NOTE — ED Triage Notes (Addendum)
Patient returns to ER with worsening insect bite to right axilla.  Reports this has caused him to be nauseated and reports he feels like he has the flu.  States he had a fever this morning but did not check this at home.  Reports he took 4 excedrin because that is all he had.  Reports taking antibiotics as prescribed.

## 2017-08-18 NOTE — Discharge Instructions (Addendum)
It is unclear why your insect bite/infection is not getting better with antibiotics prescribed.  We will try different antibiotic at this time.  We have traced around it, please pay close attention to whether it gets larger or smaller.  You may need to follow-up with a primary care or come back here for reevaluation.  Use warm compresses to treat.

## 2017-12-30 IMAGING — CR DG HIP (WITH OR WITHOUT PELVIS) 2-3V*L*
3 series · 3 of 3 positions shown · non-contrast
Comparison: None.

CLINICAL DATA: Increasing left hip pain for a month. No known
injury. Unable to bear weight today.

EXAM:
DG HIP (WITH OR WITHOUT PELVIS) 2-3V LEFT

[t pelvis ap]
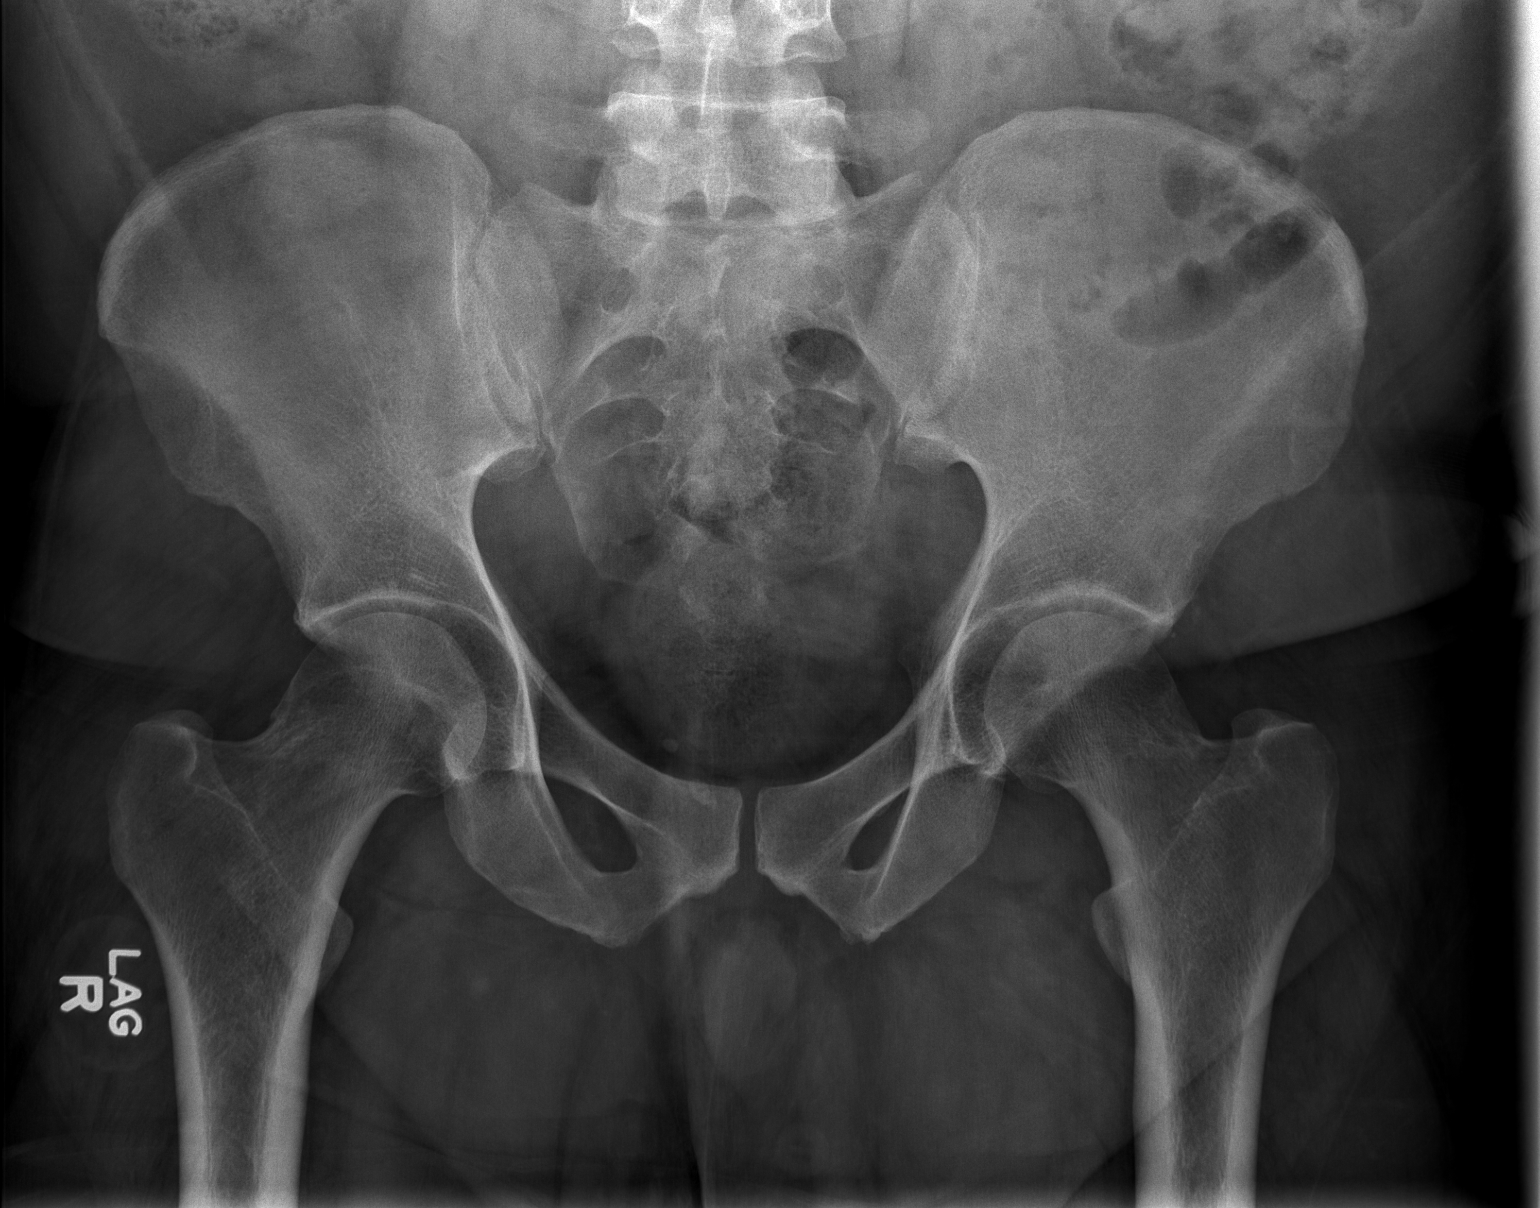

[t hip ap left]
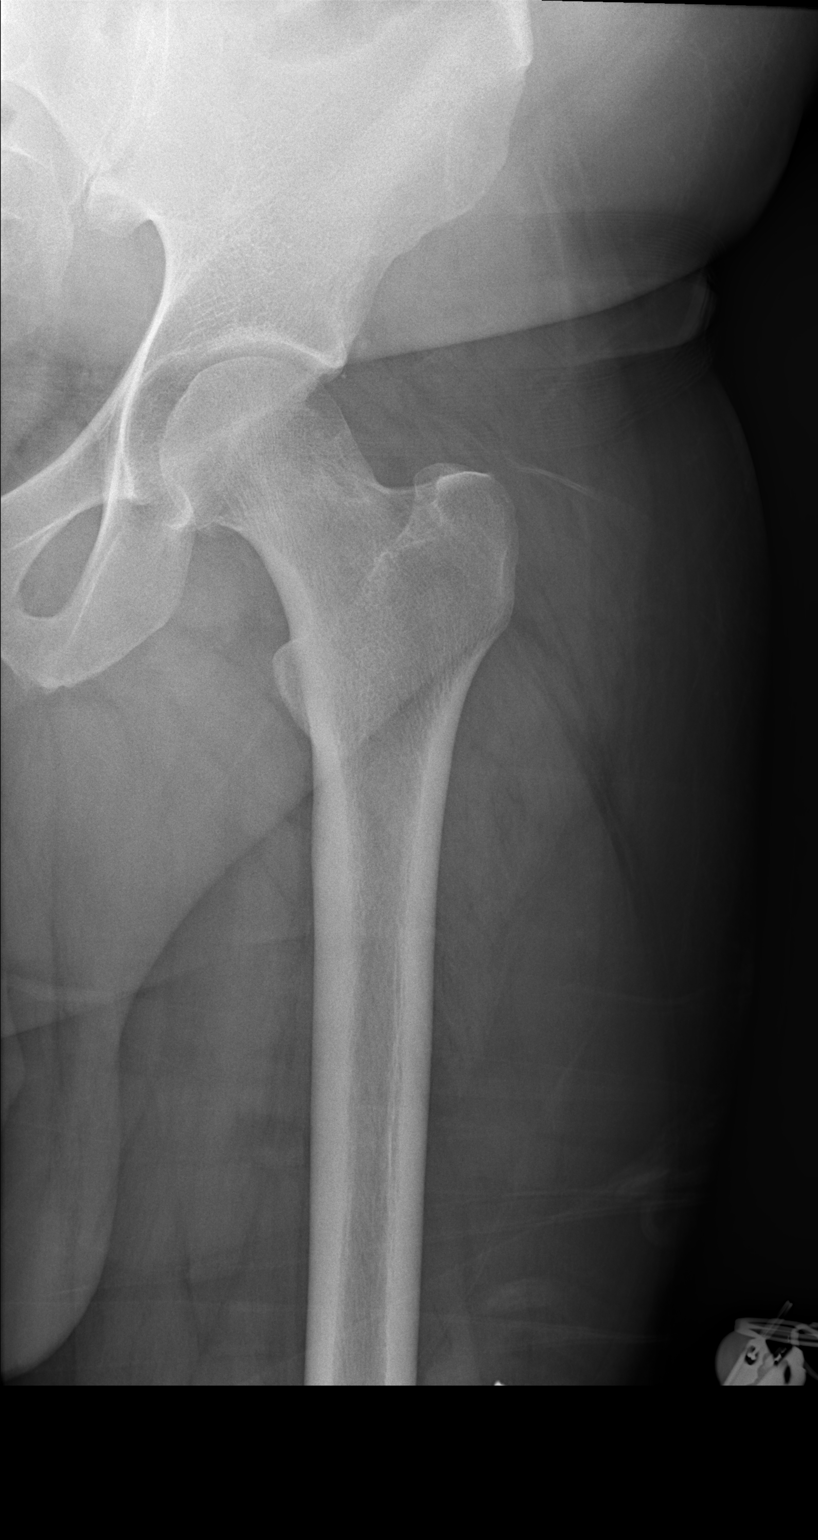

[t hip frog leg left]
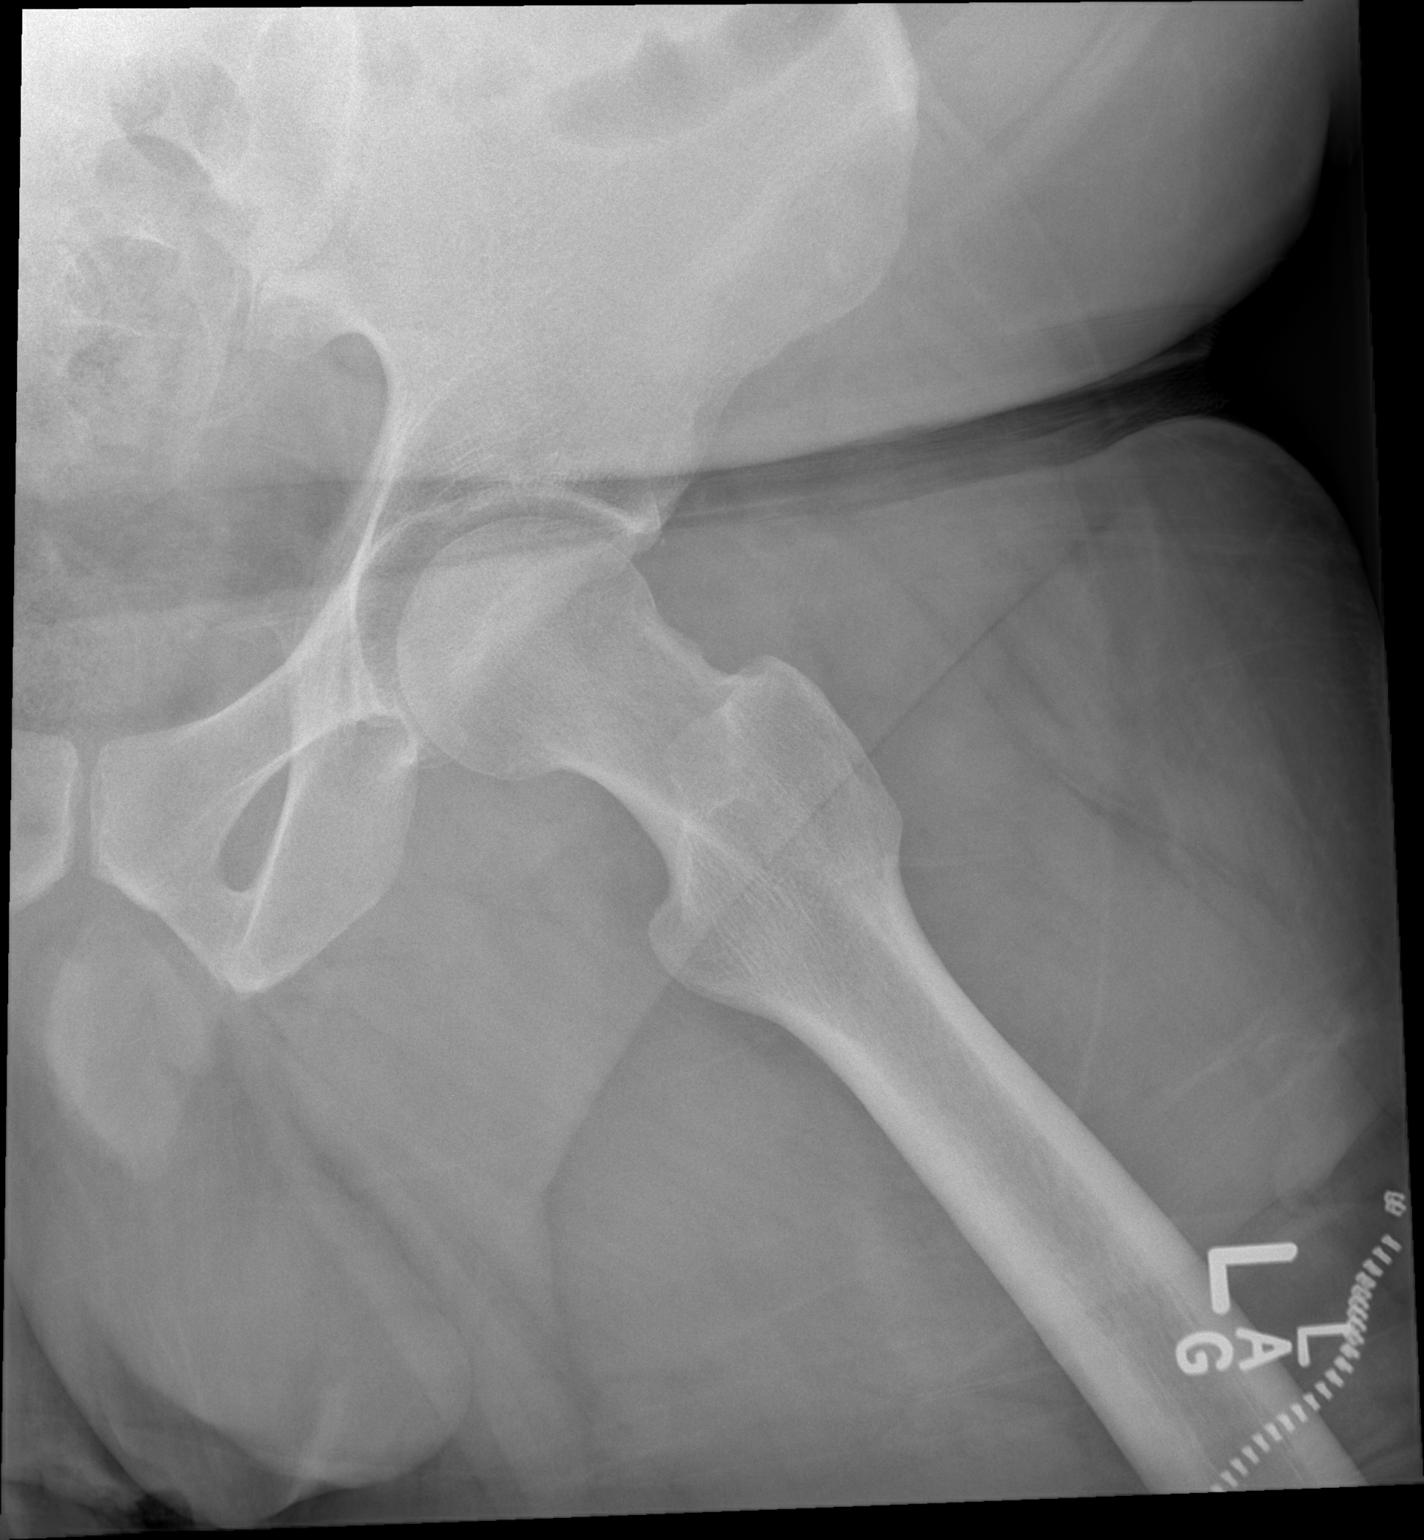

[3 of 3 positions shown; findings below may reference images not displayed]

FINDINGS: There is no evidence of hip fracture or dislocation. There is no
evidence of arthropathy or other focal bone abnormality.
IMPRESSION: Negative.
